# Patient Record
Sex: Female | Born: 1980 | Race: Asian | Hispanic: No | Marital: Married | Smoking: Former smoker
Health system: Southern US, Community
[De-identification: ages and names within clinical notes are randomized; demographics above are authoritative.]

## PROBLEM LIST (undated history)

## (undated) DIAGNOSIS — O99810 Abnormal glucose complicating pregnancy: Secondary | ICD-10-CM

## (undated) DIAGNOSIS — Z789 Other specified health status: Secondary | ICD-10-CM

## (undated) DIAGNOSIS — D649 Anemia, unspecified: Secondary | ICD-10-CM

## (undated) DIAGNOSIS — N390 Urinary tract infection, site not specified: Secondary | ICD-10-CM

## (undated) DIAGNOSIS — Z8669 Personal history of other diseases of the nervous system and sense organs: Secondary | ICD-10-CM

## (undated) HISTORY — DX: Anemia, unspecified: D64.9

## (undated) HISTORY — DX: Urinary tract infection, site not specified: N39.0

## (undated) HISTORY — DX: Personal history of other diseases of the nervous system and sense organs: Z86.69

## (undated) HISTORY — PX: NO PAST SURGERIES: SHX2092

---
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
## 2004-09-29 ENCOUNTER — Emergency Department: Payer: Self-pay | Admitting: Emergency Medicine

## 2008-08-31 ENCOUNTER — Encounter: Payer: Self-pay | Admitting: Family Medicine

## 2008-08-31 ENCOUNTER — Ambulatory Visit: Payer: Self-pay | Admitting: Obstetrics and Gynecology

## 2008-08-31 LAB — CONVERTED CEMR LAB
Antibody Screen: NEGATIVE
Basophils Relative: 0 % (ref 0–1)
Eosinophils Absolute: 0.3 10*3/uL (ref 0.0–0.7)
Hemoglobin: 13.5 g/dL (ref 12.0–15.0)
Lymphs Abs: 1.6 10*3/uL (ref 0.7–4.0)
MCHC: 32.2 g/dL (ref 30.0–36.0)
MCV: 90.1 fL (ref 78.0–100.0)
Monocytes Absolute: 0.5 10*3/uL (ref 0.1–1.0)
Monocytes Relative: 5 % (ref 3–12)
RBC: 4.65 M/uL (ref 3.87–5.11)
Rh Type: POSITIVE
Rubella: >500 intl units/mL — ABNORMAL HIGH
WBC: 9.7 10*3/uL (ref 4.0–10.5)

## 2008-09-07 ENCOUNTER — Ambulatory Visit (HOSPITAL_COMMUNITY): Admission: RE | Admit: 2008-09-07 | Discharge: 2008-09-07 | Payer: Self-pay | Admitting: Family Medicine

## 2008-09-21 ENCOUNTER — Encounter: Payer: Self-pay | Admitting: Family Medicine

## 2008-09-21 ENCOUNTER — Ambulatory Visit: Payer: Self-pay | Admitting: Obstetrics and Gynecology

## 2008-09-21 LAB — CONVERTED CEMR LAB: GC Probe Amp, Genital: NEGATIVE

## 2008-10-19 ENCOUNTER — Ambulatory Visit: Payer: Self-pay | Admitting: Obstetrics and Gynecology

## 2008-11-16 ENCOUNTER — Ambulatory Visit: Payer: Self-pay | Admitting: Obstetrics & Gynecology

## 2008-11-17 ENCOUNTER — Encounter: Payer: Self-pay | Admitting: Family Medicine

## 2008-11-17 LAB — CONVERTED CEMR LAB: Yeast Wet Prep HPF POC: NONE SEEN

## 2008-12-07 ENCOUNTER — Ambulatory Visit (HOSPITAL_COMMUNITY): Admission: RE | Admit: 2008-12-07 | Discharge: 2008-12-07 | Payer: Self-pay | Admitting: Obstetrics & Gynecology

## 2008-12-14 ENCOUNTER — Encounter: Payer: Self-pay | Admitting: Family Medicine

## 2008-12-14 ENCOUNTER — Ambulatory Visit: Payer: Self-pay | Admitting: Obstetrics & Gynecology

## 2009-01-11 ENCOUNTER — Ambulatory Visit: Payer: Self-pay | Admitting: Obstetrics & Gynecology

## 2009-02-09 ENCOUNTER — Ambulatory Visit: Payer: Self-pay | Admitting: Obstetrics & Gynecology

## 2009-02-09 LAB — CONVERTED CEMR LAB
HCT: 37.6 % (ref 36.0–46.0)
Hemoglobin: 12.5 g/dL (ref 12.0–15.0)
MCV: 89.1 fL (ref 78.0–100.0)
Platelets: 174 10*3/uL (ref 150–400)
WBC: 15.2 10*3/uL — ABNORMAL HIGH (ref 4.0–10.5)

## 2009-03-01 ENCOUNTER — Ambulatory Visit: Payer: Self-pay | Admitting: Obstetrics & Gynecology

## 2009-03-08 ENCOUNTER — Ambulatory Visit (HOSPITAL_COMMUNITY): Admission: RE | Admit: 2009-03-08 | Discharge: 2009-03-08 | Payer: Self-pay | Admitting: Family Medicine

## 2009-03-15 ENCOUNTER — Ambulatory Visit: Payer: Self-pay | Admitting: Obstetrics & Gynecology

## 2009-03-29 ENCOUNTER — Ambulatory Visit: Payer: Self-pay | Admitting: Family Medicine

## 2009-03-29 LAB — CONVERTED CEMR LAB
Chlamydia, DNA Probe: NEGATIVE
GC Probe Amp, Genital: NEGATIVE

## 2009-03-30 ENCOUNTER — Encounter: Payer: Self-pay | Admitting: Family Medicine

## 2009-04-06 ENCOUNTER — Ambulatory Visit: Payer: Self-pay | Admitting: Obstetrics & Gynecology

## 2009-04-06 LAB — CONVERTED CEMR LAB: GC Probe Amp, Genital: NEGATIVE

## 2009-04-07 ENCOUNTER — Encounter: Payer: Self-pay | Admitting: Obstetrics & Gynecology

## 2009-04-15 ENCOUNTER — Ambulatory Visit: Payer: Self-pay | Admitting: Obstetrics & Gynecology

## 2009-04-19 ENCOUNTER — Ambulatory Visit: Payer: Self-pay | Admitting: Family Medicine

## 2009-04-22 ENCOUNTER — Inpatient Hospital Stay (HOSPITAL_COMMUNITY): Admission: AD | Admit: 2009-04-22 | Discharge: 2009-04-25 | Payer: Self-pay | Admitting: Obstetrics & Gynecology

## 2009-04-22 ENCOUNTER — Ambulatory Visit: Payer: Self-pay | Admitting: Family

## 2009-06-07 ENCOUNTER — Ambulatory Visit: Payer: Self-pay | Admitting: Obstetrics and Gynecology

## 2009-06-07 ENCOUNTER — Other Ambulatory Visit: Admission: RE | Admit: 2009-06-07 | Discharge: 2009-06-07 | Payer: Self-pay | Admitting: Obstetrics and Gynecology

## 2009-06-07 ENCOUNTER — Encounter: Payer: Self-pay | Admitting: Obstetrics and Gynecology

## 2010-11-04 LAB — CBC
MCV: 91.4 fL (ref 78.0–100.0)
Platelets: 165 10*3/uL (ref 150–400)
RBC: 4.66 MIL/uL (ref 3.87–5.11)
WBC: 21.4 10*3/uL — ABNORMAL HIGH (ref 4.0–10.5)

## 2010-11-04 LAB — RPR: RPR Ser Ql: NONREACTIVE

## 2011-08-26 ENCOUNTER — Other Ambulatory Visit: Payer: Self-pay | Admitting: Obstetrics and Gynecology

## 2011-10-26 ENCOUNTER — Other Ambulatory Visit: Payer: Self-pay | Admitting: Obstetrics and Gynecology

## 2012-06-22 ENCOUNTER — Emergency Department: Payer: Self-pay | Admitting: Unknown Physician Specialty

## 2015-01-20 LAB — OB RESULTS CONSOLE RUBELLA ANTIBODY, IGM: Rubella: IMMUNE

## 2015-01-20 LAB — OB RESULTS CONSOLE VARICELLA ZOSTER ANTIBODY, IGG: Varicella: IMMUNE

## 2015-01-21 LAB — OB RESULTS CONSOLE VARICELLA ZOSTER ANTIBODY, IGG: Varicella: IMMUNE

## 2015-01-21 LAB — OB RESULTS CONSOLE RUBELLA ANTIBODY, IGM: Rubella: IMMUNE

## 2015-01-22 LAB — SICKLE CELL SCREEN: Sickle Cell Screen: NEGATIVE

## 2015-05-05 ENCOUNTER — Other Ambulatory Visit: Payer: Self-pay

## 2015-05-05 DIAGNOSIS — O283 Abnormal ultrasonic finding on antenatal screening of mother: Secondary | ICD-10-CM

## 2015-05-06 ENCOUNTER — Ambulatory Visit (HOSPITAL_BASED_OUTPATIENT_CLINIC_OR_DEPARTMENT_OTHER)
Admission: RE | Admit: 2015-05-06 | Discharge: 2015-05-06 | Disposition: A | Discharge status: Home / Self Care | Payer: Medicaid Other | Source: Ambulatory Visit

## 2015-05-06 ENCOUNTER — Ambulatory Visit
Admission: RE | Admit: 2015-05-06 | Discharge: 2015-05-06 | Disposition: A | Discharge status: Home / Self Care | Payer: Medicaid Other | Source: Ambulatory Visit | Attending: Maternal & Fetal Medicine | Admitting: Maternal & Fetal Medicine

## 2015-05-06 DIAGNOSIS — O283 Abnormal ultrasonic finding on antenatal screening of mother: Secondary | ICD-10-CM | POA: Diagnosis not present

## 2015-05-06 DIAGNOSIS — Z3A26 26 weeks gestation of pregnancy: Secondary | ICD-10-CM | POA: Insufficient documentation

## 2015-05-06 DIAGNOSIS — Z31438 Encounter for other genetic testing of female for procreative management: Secondary | ICD-10-CM | POA: Insufficient documentation

## 2015-05-06 HISTORY — DX: Other specified health status: Z78.9

## 2015-05-06 NOTE — Progress Notes (Signed)
Agree with assessment and plan as outlined by CGC wells.

## 2015-05-06 NOTE — Progress Notes (Signed)
Referring provider: [facility] Department Length of Consultation: 20 minutes   Brooke Anthony  was referred for genetic counseling following a level 2 ultrasound at [facility].  This letter is a summary of our discussion.  At the time of this visit, a detailed ultrasound was performed.  The gestational age was confirmed to be 26 weeks.  The detailed fetal anatomy was seen and appeared normal, including the structures of the fetal heart.  An echogenic intracardiac focus was noted in the left and right ventricles of the heart.    An echogenic intracardiac focus (EIF) is a bright spot in the heart.  They are usually found in the left ventricle, which is one of the lower chambers of the heart.  This finding is thought to occur in at least 3-5% of second trimester ultrasounds as a result of microcalcifications in the papillary muscle of the heart.  While it is most likely a normal variation, some studies have found an association between this finding and chromosome abnormalities.  The current data suggests that as an isolated ultrasound finding, the chance for a chromosome problem is not expected to be higher than the risk from an amniocentesis (1 in 200).  This is particularly thought to be true in women under the age of 35 years or who have had normal results from some type of maternal serum screen for aneuploidy.  To review, chromosomes are the inherited structures that contain our genes (traits).  Each cell of our body normally has 46 chromosomes, matched up in 23 pairs.  The last pair determines our gender and are called the sex chromosomes.  A female has an X and a Y chromosome, while a female has two X chromosomes.  Rarely, when a mother's egg or father's sperm unite, an extra or missing chromosome can be passed on to the baby by mistake.  We discussed examples of such a problem including: Down syndrome (an extra 74) and Edward syndrome (an extra 18), both involving  some degree of mental retardation and physical problems.  Before this ultrasound, there was a 1 in approximately 244 chance to having a baby with a chromosome problem based on Brooke Anthony's age.  Now that the echogenic focus has been seen, the risk has increased, though exactly by how much is difficult to determine.    We discussed the following prenatal testing options for this pregnancy:  Amniocentesis involves the removal of a small amount of amniotic fluid from the sac surrounding the fetus with the use of a thin needle inserted through the mother's abdomen and uterus.  Ultrasound guidance is used throughout the procedure.  Fetal cells are directly evaluated and >99.5% of chromosome problems and >98% of neural tube defects can be detected.  The main risks to this procedure are complications leading to miscarriage in less than 1 in 200 cases (0.5%).  Ultrasound can sometimes detect other fetal anatomy problems (e.g. a heart defect) that suggest a chromosome problem; however, this is often not the case.  If the remainder of the fetal anatomy was seen well today, we do not recommend additional imaging of the fetal heart in the future.  We also reviewed the availability of cell free fetal DNA testing from maternal blood to determine whether or not the baby may have Down syndrome, trisomy 65, or trisomy 66.  This test utilizes a maternal blood sample and DNA sequencing technology to isolate circulating cell free fetal DNA from maternal plasma.  The fetal  DNA can then be analyzed for DNA sequences that are derived from the three most common chromosomes involved in aneuploidy, chromosomes 13, 18, and 21.  If the overall amount of DNA is greater than the expected level for any of these chromosomes, aneuploidy is suspected. While we do not consider it a replacement for invasive testing and karyotype analysis, a negative result from this testing would be reassuring, though not a guarantee of a normal chromosome  complement for the baby.  An abnormal result is certainly suggestive of an abnormal chromosome complement, though we would still recommend CVS or amniocentesis to confirm any findings from this testing.    Lastly, we mentioned that each pregnancy in the general population has a 2-3% chance for a birth defect that might not be detected prenatally.  We inquired about the family history.  The family history was reported to be unremarkable for birth defects, mental retardation, recurrent pregnancy loss or known chromosome abnormalities.  The pregnancy history was reported to be unremarkable for complications or exposures which would be expected to increase the concern for this pregnancy.   After consideration of the options, the patient elected to decline any additional screening or testing.  The patient was encouraged to call with questions or concerns.  We can be contacted at [phone].   Cherly Anderson, MS, CGC

## 2015-07-06 ENCOUNTER — Emergency Department: Payer: Medicaid Other

## 2015-07-06 ENCOUNTER — Encounter: Payer: Self-pay | Admitting: Emergency Medicine

## 2015-07-06 ENCOUNTER — Emergency Department
Admission: EM | Admit: 2015-07-06 | Discharge: 2015-07-06 | Disposition: A | Discharge status: Home / Self Care | Payer: Medicaid Other | Attending: Emergency Medicine | Admitting: Emergency Medicine

## 2015-07-06 DIAGNOSIS — Z3A35 35 weeks gestation of pregnancy: Secondary | ICD-10-CM | POA: Diagnosis not present

## 2015-07-06 DIAGNOSIS — O1203 Gestational edema, third trimester: Secondary | ICD-10-CM | POA: Insufficient documentation

## 2015-07-06 DIAGNOSIS — R0602 Shortness of breath: Secondary | ICD-10-CM | POA: Insufficient documentation

## 2015-07-06 DIAGNOSIS — R911 Solitary pulmonary nodule: Secondary | ICD-10-CM | POA: Insufficient documentation

## 2015-07-06 DIAGNOSIS — O99333 Smoking (tobacco) complicating pregnancy, third trimester: Secondary | ICD-10-CM | POA: Diagnosis not present

## 2015-07-06 DIAGNOSIS — Z79899 Other long term (current) drug therapy: Secondary | ICD-10-CM | POA: Diagnosis not present

## 2015-07-06 DIAGNOSIS — O9989 Other specified diseases and conditions complicating pregnancy, childbirth and the puerperium: Secondary | ICD-10-CM | POA: Insufficient documentation

## 2015-07-06 DIAGNOSIS — R079 Chest pain, unspecified: Secondary | ICD-10-CM | POA: Diagnosis not present

## 2015-07-06 DIAGNOSIS — F17200 Nicotine dependence, unspecified, uncomplicated: Secondary | ICD-10-CM | POA: Insufficient documentation

## 2015-07-06 LAB — CBC
HCT: 38.9 % (ref 35.0–47.0)
Hemoglobin: 13 g/dL (ref 12.0–16.0)
MCH: 30.3 pg (ref 26.0–34.0)
MCHC: 33.3 g/dL (ref 32.0–36.0)
MCV: 91 fL (ref 80.0–100.0)
PLATELETS: 180 10*3/uL (ref 150–440)
RBC: 4.27 MIL/uL (ref 3.80–5.20)
RDW: 13.5 % (ref 11.5–14.5)
WBC: 18.8 10*3/uL — ABNORMAL HIGH (ref 3.6–11.0)

## 2015-07-06 LAB — BASIC METABOLIC PANEL
Anion gap: 7 (ref 5–15)
BUN: 8 mg/dL (ref 6–20)
CALCIUM: 8.9 mg/dL (ref 8.9–10.3)
CO2: 23 mmol/L (ref 22–32)
CREATININE: 0.36 mg/dL — AB (ref 0.44–1.00)
Chloride: 105 mmol/L (ref 101–111)
GFR calc Af Amer: >60 mL/min (ref >60)
GFR calc non Af Amer: >60 mL/min (ref >60)
GLUCOSE: 87 mg/dL (ref 65–99)
Potassium: 4 mmol/L (ref 3.5–5.1)
Sodium: 135 mmol/L (ref 135–145)

## 2015-07-06 LAB — BRAIN NATRIURETIC PEPTIDE: B Natriuretic Peptide: 12 pg/mL (ref 0.0–100.0)

## 2015-07-06 LAB — TROPONIN I

## 2015-07-06 MED ORDER — IOHEXOL 350 MG/ML SOLN
75.0000 mL | Freq: Once | INTRAVENOUS | Status: AC | PRN
  Administered 2015-07-06: 75 mL via INTRAVENOUS
  Filled 2015-07-06: qty 75

## 2015-07-06 NOTE — ED Provider Notes (Signed)
[facility] Emergency Department Provider Note   ____________________________________________  Time seen:  I have reviewed the triage vital signs and the triage nursing note.  HISTORY  Chief Complaint Chest Pain and Shortness of Breath   Historian Patient  HPI Brooke Anthony is a 34 y.o. female who was sent here for evaluation of intermittent complaint of chest pain, as well as an episode of coughing up a small my blood this weekend, from [facility]. She is currently [redacted] weeks pregnant. She has had some mild lower extremity edema towards the end of pregnancy. She's had some mild shortness of breath but no specific pleuritic chest pain. The chest discomfort has not been exertional and is located centrally and feels like pressure, but none now and none today.  No fever. No recent illness.    Past Medical History  Diagnosis Date  . Medical history non-contributory     Patient Active Problem List   Diagnosis Date Noted  . Echogenic intracardiac focus of fetus on prenatal ultrasound 05/06/2015    Past Surgical History  Procedure Laterality Date  . No past surgeries      Current Outpatient Rx  Name  Route  Sig  Dispense  Refill  . Prenatal Vit-Fe Fumarate-FA (PRENATAL MULTIVITAMIN) TABS tablet   Oral   Take 1 tablet by mouth daily at 12 noon.           Allergies Review of patient's allergies indicates no known allergies.  History reviewed. No pertinent family history.  Social History Social History  Substance Use Topics  . Smoking status: Current Every Day Smoker -- 0.50 packs/day  . Smokeless tobacco: Never Used  . Alcohol Use: No    Review of Systems  Constitutional: Negative for fever. Eyes: Negative for visual changes. ENT: Negative for sore throat. Cardiovascular: Negative for palpitations. Respiratory: Negative for pleuritic chest pain. Gastrointestinal: Negative for abdominal pain,  vomiting and diarrhea. Genitourinary: Negative for dysuria. Musculoskeletal: Negative for back pain. Skin: Negative for rash. Neurological: Negative for headache. 10 point Review of Systems otherwise negative ____________________________________________   PHYSICAL EXAM:  VITAL SIGNS: ED Triage Vitals  Enc Vitals Group     BP 07/06/15 1310 104/61 mmHg     Pulse Rate 07/06/15 1310 93     Resp 07/06/15 1310 18     Temp 07/06/15 1310 98.3 F (36.8 C)     Temp Source 07/06/15 1310 Oral     SpO2 07/06/15 1310 98 %     Weight 07/06/15 1310 156 lb (70.761 kg)     Height 07/06/15 1310  (1.549 m)     Head Cir --      Peak Flow --      Pain Score 07/06/15 1312 0     Pain Loc --      Pain Edu? --      Excl. in GC? --      Constitutional: Alert and oriented. Well appearing and in no distress. Eyes: Conjunctivae are normal. PERRL. Normal extraocular movements. ENT   Head: Normocephalic and atraumatic.   Nose: No congestion/rhinnorhea.   Mouth/Throat: Mucous membranes are moist.   Neck: No stridor. Cardiovascular/Chest: Normal rate, regular rhythm.  No murmurs, rubs, or gallops. Respiratory: Normal respiratory effort without tachypnea nor retractions. Breath sounds are clear and equal bilaterally. No wheezes/rales/rhonchi. Gastrointestinal: Soft. No distention, no guarding, no rebound. Gravid consistent with third trimester.  Genitourinary/rectal:Deferred Musculoskeletal: Nontender with normal range of motion in all  extremities. No joint effusions.  No lower extremity tenderness.  Trace lower extremity edema. Neurologic:  Normal speech and language. No gross or focal neurologic deficits are appreciated. Skin:  Skin is warm, dry and intact. No rash noted. Psychiatric: Mood and affect are normal. Speech and behavior are normal. Patient exhibits appropriate insight and judgment.  ____________________________________________   EKG I, Governor Rooksebecca Netasha Wehrli, MD, the attending  physician have personally viewed and interpreted all ECGs.  100 bpm. Normal sinus rhythm. Narrow QRS. Axis. Nonspecific ST/T wave. Findings of S wave in lead 1, Q waves in lead 3 and inverted T-wave in lead 3 ____________________________________________  LABS (pertinent positives/negatives)  Basic metabolic panel without significant abnormality CBC shows a white blood count of 18.8, hemoglobin 13.0 and 3180 Troponin less than 0.03 BNP: 12  ____________________________________________  RADIOLOGY All Xrays were viewed by me. Imaging interpreted by Radiologist.  CT chest with contrast:  IMPRESSION: No demonstrable pulmonary embolus. No thoracic aortic aneurysm or dissection. No edema or consolidation.  There is a 4 mm nodular opacity in the right upper lobe. Followup of this nodular opacity should be based on Fleischner Society guidelines. If the patient is at high risk for bronchogenic carcinoma, follow-up chest CT at 1 year is recommended. If the patient is at low risk, no follow-up is needed. This recommendation follows the consensus statement: Guidelines for Management of Small Pulmonary Nodules Detected on CT Scans: A Statement from the Fleischner Society as published in Radiology 2005; 237:395-400.  No thoracic adenopathy.  Hepatic steatosis.  Chest x-ray two-view:  IMPRESSION: 1. Mild pulmonary arterial prominence is likely secondary to pregnancy. 2. No focal airspace disease or effusion. __________________________________________  PROCEDURES  Procedure(s) performed: None  Critical Care performed: None  ____________________________________________   ED COURSE / ASSESSMENT AND PLAN  CONSULTATIONS: None  Pertinent labs & imaging results that were available during my care of the patient were reviewed by me and considered in my medical decision making (see chart for details).   Patient seems to be asymptomatic here with no chest pain or shortness of  breath. She's not hypoxic. She is not a fever. Initially I felt her symptoms very unlikely to be due to a pulmonary embolism, however upon reviewing her EKG showing a slight tachycardia of heartbeat 100, and long with the findings of S1, every 3, T3, I did discuss the risk versus benefit of obtaining CT to rule out pulmonary embolism with the patient we chose to proceed. I did discuss the choice of CT versus VQ with the radiologist, who recommended CT with shielding.  I did add on a BNP as patient is ready to leave the emergency department. My suspicion for congestive heart failure/ peripartum heart failure is low given again she seems asymptomatic now. I will call her with the result of this.  We discussed the incidental finding of pulmonary nodule and she is a smoker, and so I did discuss the recommendation of repeat CAT scan at one year. She is to follow-up with a primary care physician in order to complete this.  BNP came back normal at 12  Patient / Family / Caregiver informed of clinical course, medical decision-making process, and agree with plan.   I discussed return precautions, follow-up instructions, and discharged instructions with patient and/or family.  ___________________________________________   FINAL CLINICAL IMPRESSION(S) / ED DIAGNOSES   Final diagnoses:  Nonspecific chest pain  Pulmonary nodule       Governor Rooksebecca Holten Spano, MD 07/06/15 1757

## 2015-07-06 NOTE — ED Notes (Addendum)
Pt seen by her obstetrician just pta; pt is due 08/08/14 with 3rd child;  sent to ED for evaluation for PE; c/o chest pain and shortness of breath for 3 weeks; states she was coughing up blood this weekend;

## 2015-07-06 NOTE — ED Notes (Signed)
[redacted]wks pregnant   Pt denies pain - reports some chest pain over the weekend along with some bloody sputum  Reports cold symptoms for approx 1 week

## 2015-07-06 NOTE — Discharge Instructions (Signed)
You were evaluated for episodes of chest discomfort, and your exam and evaluation are reassuring today. We discussed your CT scan showed a pulmonary nodule, discussed follow-up for this with your primary care physician.  You will be called tonight with results of one more blood test, looking for signs of heart failure.  Return to the emergency department for any new or worsening condition including chest pain, trouble breathing, shortness of breath, dizziness, weakness, numbness, fever, or any other symptoms concerning to you.    Nonspecific Chest Pain  Chest pain can be caused by many different conditions. There is always a chance that your pain could be related to something serious, such as a heart attack or a blood clot in your lungs. Chest pain can also be caused by conditions that are not life-threatening. If you have chest pain, it is very important to follow up with your health care provider. CAUSES  Chest pain can be caused by:  Heartburn.  Pneumonia or bronchitis.  Anxiety or stress.  Inflammation around your heart (pericarditis) or lung (pleuritis or pleurisy).  A blood clot in your lung.  A collapsed lung (pneumothorax). It can develop suddenly on its own (spontaneous pneumothorax) or from trauma to the chest.  Shingles infection (varicella-zoster virus).  Heart attack.  Damage to the bones, muscles, and cartilage that make up your chest wall. This can include:  Bruised bones due to injury.  Strained muscles or cartilage due to frequent or repeated coughing or overwork.  Fracture to one or more ribs.  Sore cartilage due to inflammation (costochondritis). RISK FACTORS  Risk factors for chest pain may include:  Activities that increase your risk for trauma or injury to your chest.  Respiratory infections or conditions that cause frequent coughing.  Medical conditions or overeating that can cause heartburn.  Heart disease or family history of heart  disease.  Conditions or health behaviors that increase your risk of developing a blood clot.  Having had chicken pox (varicella zoster). SIGNS AND SYMPTOMS Chest pain can feel like:  Burning or tingling on the surface of your chest or deep in your chest.  Crushing, pressure, aching, or squeezing pain.  Dull or sharp pain that is worse when you move, cough, or take a deep breath.  Pain that is also felt in your back, neck, shoulder, or arm, or pain that spreads to any of these areas. Your chest pain may come and go, or it may stay constant. DIAGNOSIS Lab tests or other studies may be needed to find the cause of your pain. Your health care provider may have you take a test called an ambulatory ECG (electrocardiogram). An ECG records your heartbeat patterns at the time the test is performed. You may also have other tests, such as:  Transthoracic echocardiogram (TTE). During echocardiography, sound waves are used to create a picture of all of the heart structures and to look at how blood flows through your heart.  Transesophageal echocardiogram (TEE).This is a more advanced imaging test that obtains images from inside your body. It allows your health care provider to see your heart in finer detail.  Cardiac monitoring. This allows your health care provider to monitor your heart rate and rhythm in real time.  Holter monitor. This is a portable device that records your heartbeat and can help to diagnose abnormal heartbeats. It allows your health care provider to track your heart activity for several days, if needed.  Stress tests. These can be done through exercise or by taking  medicine that makes your heart beat more quickly.  Blood tests.  Imaging tests. TREATMENT  Your treatment depends on what is causing your chest pain. Treatment may include:  Medicines. These may include:  Acid blockers for heartburn.  Anti-inflammatory medicine.  Pain medicine for inflammatory  conditions.  Antibiotic medicine, if an infection is present.  Medicines to dissolve blood clots.  Medicines to treat coronary artery disease.  Supportive care for conditions that do not require medicines. This may include:  Resting.  Applying heat or cold packs to injured areas.  Limiting activities until pain decreases. HOME CARE INSTRUCTIONS  If you were prescribed an antibiotic medicine, finish it all even if you start to feel better.  Avoid any activities that bring on chest pain.  Do not use any tobacco products, including cigarettes, chewing tobacco, or electronic cigarettes. If you need help quitting, ask your health care provider.  Do not drink alcohol.  Take medicines only as directed by your health care provider.  Keep all follow-up visits as directed by your health care provider. This is important. This includes any further testing if your chest pain does not go away.  If heartburn is the cause for your chest pain, you may be told to keep your head raised (elevated) while sleeping. This reduces the chance that acid will go from your stomach into your esophagus.  Make lifestyle changes as directed by your health care provider. These may include:  Getting regular exercise. Ask your health care provider to suggest some activities that are safe for you.  Eating a heart-healthy diet. A registered dietitian can help you to learn healthy eating options.  Maintaining a healthy weight.  Managing diabetes, if necessary.  Reducing stress. SEEK MEDICAL CARE IF:  Your chest pain does not go away after treatment.  You have a rash with blisters on your chest.  You have a fever. SEEK IMMEDIATE MEDICAL CARE IF:   Your chest pain is worse.  You have an increasing cough, or you cough up blood.  You have severe abdominal pain.  You have severe weakness.  You faint.  You have chills.  You have sudden, unexplained chest discomfort.  You have sudden, unexplained  discomfort in your arms, back, neck, or jaw.  You have shortness of breath at any time.  You suddenly start to sweat, or your skin gets clammy.  You feel nauseous or you vomit.  You suddenly feel light-headed or dizzy.  Your heart begins to beat quickly, or it feels like it is skipping beats. These symptoms may represent a serious problem that is an emergency. Do not wait to see if the symptoms will go away. Get medical help right away. Call your local emergency services (911 in the [place] Do not drive yourself to the hospital.   This information is not intended to replace advice given to you by your health care provider. Make sure you discuss any questions you have with your health care provider.   Document Released: 04/26/2005 Document Revised: 08/07/2014 Document Reviewed: 02/20/2014 Elsevier Interactive Patient Education [employer]

## 2015-08-01 NOTE — L&D Delivery Note (Signed)
Obstetrical Delivery Note   Date of Delivery:   08/04/2015 Primary OB:   [facility] Gestational Age/EDD: 4049w2d  Antepartum complications: fetal bilateral echogenic foci (cardiac), tobacco abuse  Delivered By:   Cornelia Copaharlie Maayan Jenning, Jr MD  Delivery Type:   spontaneous vaginal delivery  Delivery Details:   CTSP and on SVE was complete and +2. Over next UC, patient delivered LOA infant (nucahl cord x 1 and reduced) w/o difficulty. Delayed cord clamping and cut by father Anesthesia:    local Intrapartum complications: None GBS:    Unknown but no risk factors so not treated. Swab obtained on admission and pending Laceration:    1st degree repaired and left labia majora skin abrasion repaired. Both with 2-0 and 3-0 vicryl after injection of 1% lidocaine Episiotomy:    none Placenta:    Delivered and expressed via active management. Intact: yes. To pathology: no.  Estimated Blood Loss:  250mL  Baby:    Liveborn female, APGARs 8/9, weight 3460gm  Cornelia Copaharlie Alitzel Cookson, Jr. MD [facility] Pager [phone]

## 2015-08-04 ENCOUNTER — Encounter: Payer: Self-pay | Admitting: *Deleted

## 2015-08-04 ENCOUNTER — Inpatient Hospital Stay
Admission: EM | Admit: 2015-08-04 | Discharge: 2015-08-06 | DRG: 775 | Disposition: A | Discharge status: Home / Self Care | Payer: Medicaid Other | Attending: Obstetrics and Gynecology | Admitting: Obstetrics and Gynecology

## 2015-08-04 DIAGNOSIS — Z3A39 39 weeks gestation of pregnancy: Secondary | ICD-10-CM | POA: Diagnosis not present

## 2015-08-04 DIAGNOSIS — F17200 Nicotine dependence, unspecified, uncomplicated: Secondary | ICD-10-CM | POA: Diagnosis present

## 2015-08-04 DIAGNOSIS — Z79899 Other long term (current) drug therapy: Secondary | ICD-10-CM

## 2015-08-04 DIAGNOSIS — O99334 Smoking (tobacco) complicating childbirth: Secondary | ICD-10-CM | POA: Diagnosis present

## 2015-08-04 LAB — CBC
HEMATOCRIT: 42.8 % (ref 35.0–47.0)
Hemoglobin: 14.2 g/dL (ref 12.0–16.0)
MCH: 30.5 pg (ref 26.0–34.0)
MCHC: 33.3 g/dL (ref 32.0–36.0)
MCV: 91.5 fL (ref 80.0–100.0)
Platelets: 154 10*3/uL (ref 150–440)
RBC: 4.67 MIL/uL (ref 3.80–5.20)
RDW: 14.3 % (ref 11.5–14.5)
WBC: 17.7 10*3/uL — AB (ref 3.6–11.0)

## 2015-08-04 LAB — TYPE AND SCREEN
ABO/RH(D): O POS
Antibody Screen: NEGATIVE

## 2015-08-04 LAB — CHLAMYDIA/NGC RT PCR (ARMC ONLY)
Chlamydia Tr: NOT DETECTED
N GONORRHOEAE: NOT DETECTED

## 2015-08-04 LAB — ABO/RH: ABO/RH(D): O POS

## 2015-08-04 LAB — URINE DRUG SCREEN, QUALITATIVE (ARMC ONLY)
AMPHETAMINES, UR SCREEN: NOT DETECTED
Barbiturates, Ur Screen: NOT DETECTED
Benzodiazepine, Ur Scrn: NOT DETECTED
COCAINE METABOLITE, UR ~~LOC~~: NOT DETECTED
Cannabinoid 50 Ng, Ur ~~LOC~~: NOT DETECTED
MDMA (ECSTASY) UR SCREEN: NOT DETECTED
Methadone Scn, Ur: NOT DETECTED
Opiate, Ur Screen: NOT DETECTED
PHENCYCLIDINE (PCP) UR S: NOT DETECTED
Tricyclic, Ur Screen: NOT DETECTED

## 2015-08-04 LAB — RAPID HIV SCREEN (HIV 1/2 AB+AG)
HIV 1/2 Antibodies: NONREACTIVE
HIV-1 P24 Antigen - HIV24: NONREACTIVE

## 2015-08-04 MED ORDER — PRENATAL MULTIVITAMIN CH
1.0000 | ORAL_TABLET | Freq: Every day | ORAL | Status: DC
  Administered 2015-08-04 – 2015-08-06 (×3): 1 via ORAL
  Filled 2015-08-04 (×3): qty 1

## 2015-08-04 MED ORDER — ACETAMINOPHEN 325 MG PO TABS: 650.0000 mg | ORAL_TABLET | ORAL | Status: DC | PRN

## 2015-08-04 MED ORDER — OXYTOCIN 10 UNIT/ML IJ SOLN: 2.5000 [IU]/h | INTRAVENOUS | Status: DC | PRN

## 2015-08-04 MED ORDER — OXYCODONE-ACETAMINOPHEN 5-325 MG PO TABS
1.0000 | ORAL_TABLET | Freq: Four times a day (QID) | ORAL | Status: DC | PRN
  Administered 2015-08-04: 1 via ORAL
  Filled 2015-08-04: qty 1

## 2015-08-04 MED ORDER — LACTATED RINGERS IV SOLN
INTRAVENOUS | Status: DC
  Administered 2015-08-04: 07:00:00 via INTRAVENOUS

## 2015-08-04 MED ORDER — CITRIC ACID-SODIUM CITRATE 334-500 MG/5ML PO SOLN: 30.0000 mL | ORAL | Status: DC | PRN

## 2015-08-04 MED ORDER — DOCUSATE SODIUM 100 MG PO CAPS: 100.0000 mg | ORAL_CAPSULE | Freq: Two times a day (BID) | ORAL | Status: DC | PRN

## 2015-08-04 MED ORDER — LIDOCAINE HCL (PF) 1 % IJ SOLN
INTRAMUSCULAR | Status: AC
  Administered 2015-08-04: 30 mL via SUBCUTANEOUS
  Filled 2015-08-04: qty 30

## 2015-08-04 MED ORDER — ONDANSETRON HCL 4 MG/2ML IJ SOLN: 4.0000 mg | Freq: Four times a day (QID) | INTRAMUSCULAR | Status: DC | PRN

## 2015-08-04 MED ORDER — AMMONIA AROMATIC IN INHA
RESPIRATORY_TRACT | Status: AC
  Filled 2015-08-04: qty 10

## 2015-08-04 MED ORDER — WITCH HAZEL-GLYCERIN EX PADS: 1.0000 "application " | MEDICATED_PAD | CUTANEOUS | Status: DC | PRN

## 2015-08-04 MED ORDER — ONDANSETRON HCL 4 MG PO TABS: 4.0000 mg | ORAL_TABLET | ORAL | Status: DC | PRN

## 2015-08-04 MED ORDER — OXYTOCIN 10 UNIT/ML IJ SOLN: 10.0000 [IU] | Freq: Once | INTRAMUSCULAR | Status: DC

## 2015-08-04 MED ORDER — DIPHENHYDRAMINE HCL 25 MG PO CAPS: 25.0000 mg | ORAL_CAPSULE | Freq: Four times a day (QID) | ORAL | Status: DC | PRN

## 2015-08-04 MED ORDER — OXYTOCIN 10 UNIT/ML IJ SOLN
INTRAMUSCULAR | Status: AC
  Filled 2015-08-04: qty 2

## 2015-08-04 MED ORDER — DIBUCAINE 1 % RE OINT: 1.0000 "application " | TOPICAL_OINTMENT | RECTAL | Status: DC | PRN

## 2015-08-04 MED ORDER — LIDOCAINE HCL (PF) 1 % IJ SOLN
30.0000 mL | INTRAMUSCULAR | Status: DC | PRN
  Administered 2015-08-04: 30 mL via SUBCUTANEOUS
  Filled 2015-08-04: qty 30

## 2015-08-04 MED ORDER — IBUPROFEN 600 MG PO TABS
600.0000 mg | ORAL_TABLET | Freq: Four times a day (QID) | ORAL | Status: DC
  Administered 2015-08-04 – 2015-08-06 (×8): 600 mg via ORAL
  Filled 2015-08-04 (×8): qty 1

## 2015-08-04 MED ORDER — LACTATED RINGERS IV SOLN: 500.0000 mL | INTRAVENOUS | Status: DC | PRN

## 2015-08-04 MED ORDER — OXYTOCIN BOLUS FROM INFUSION
500.0000 mL | INTRAVENOUS | Status: DC
  Administered 2015-08-04: 250 mL via INTRAVENOUS

## 2015-08-04 MED ORDER — LANOLIN HYDROUS EX OINT: TOPICAL_OINTMENT | CUTANEOUS | Status: DC | PRN

## 2015-08-04 MED ORDER — MISOPROSTOL 200 MCG PO TABS
ORAL_TABLET | ORAL | Status: AC
  Filled 2015-08-04: qty 4

## 2015-08-04 MED ORDER — ONDANSETRON HCL 4 MG/2ML IJ SOLN: 4.0000 mg | INTRAMUSCULAR | Status: DC | PRN

## 2015-08-04 MED ORDER — OXYTOCIN 40 UNITS IN LACTATED RINGERS INFUSION - SIMPLE MED
2.5000 [IU]/h | INTRAVENOUS | Status: DC
  Administered 2015-08-04: 2.5 [IU]/h via INTRAVENOUS
  Filled 2015-08-04: qty 1000

## 2015-08-04 MED ORDER — SIMETHICONE 80 MG PO CHEW: 80.0000 mg | CHEWABLE_TABLET | ORAL | Status: DC | PRN

## 2015-08-04 MED ORDER — BENZOCAINE-MENTHOL 20-0.5 % EX AERO: 1.0000 "application " | INHALATION_SPRAY | CUTANEOUS | Status: DC | PRN

## 2015-08-04 NOTE — Progress Notes (Signed)
L&D Note  Starting to feeling stronger UCs. Thinking about pain medications AF VS normal and stable Category I with accels and q1-8938m UCs Mild distress with UCs 8/90/-1/BBOW-->AROM, clear fluid 8/90/0 to +1, no caput Cbc normal  A/P: pt doing well Fetal status reassuring GBS and GC/CT obtained. Pt denies any h/o GBS, group b strep or any NN infections After d/w pt, she was amenable to no IV meds and holding off on epidural and to do AROM. Pelvis adequate, EFW 3600gm with largest prior 7.5lbs. Anticipate SVD  Brooke Anthony, Jr MD [facility]  Pager: [phone]

## 2015-08-04 NOTE — Discharge Summary (Signed)
Obstetrical Discharge Summary  Date of Admission: 08/04/2015 Date of Discharge: 08/06/15 Primary OB: [facility]  Gestational Age at Delivery: 7672w2d   Antepartum complications: fetal bilateral echogenic foci (cardiac), tobacco abuse Reason for Admission: active labor Date of Delivery: 08/06/2015  Delivered By: Cornelia Copaharlie Pickens, Jr MD Delivery Type: spontaneous vaginal delivery Intrapartum complications/course: None Anesthesia: local Placenta: expressed. Intact: yes. To pathology: no.  Laceration: 1st degree repaired and left labia majora abrasion repaired (both with 3-0 and 2-0 vicryl) Episiotomy: none Baby: Liveborn female, APGARs8/9, weight 3460 g.   Postpartum course: uncomplicated care following vaginal delivery Discharge Vital Signs:  Current Vital Signs 24h Vital Sign Ranges  T 98.5 F (36.9 C) Temp  Avg: 98.5 F (36.9 C)  Min: 98.4 F (36.9 C)  Max: 98.6 F (37 C)  BP 123/60 mmHg BP  Min: 114/74  Max: 138/81  HR 83 Pulse  Avg: 86  Min: 74  Max: 96  RR 18 Resp  Avg: 18.5  Min: 18  Max: 20  SaO2 100 % Not Delivered SpO2  Avg: 100 %  Min: 100 %  Max: 100 %       24 Hour I/O Current Shift I/O  Time Ins Outs        Patient Vitals for the past 6 hrs:  BP Temp Temp src Pulse Resp  08/06/15 0807 123/60 mmHg 98.5 F (36.9 C) Oral 83 18    Discharge Exam:  NAD Perineum: 1st degree repaired Abdomen: firm fundus below the umbilicus.  RRR no MRGs CTAB Ext: no c/c/e  Disposition: Home  Rh Immune globulin given: not applicable Rubella vaccine given: not applicable Tdap vaccine given in AP or PP setting: yes Flu vaccine given in AP or PP setting: yes  Contraception: IUD     Prenatal/Postnatal Panel: O POS//Rubella Immune//Varicella Immune//RPR negative//HIV negative/HepB Surface Ag negative/Plan; breastfeeding  Plan:  Brooke Anthony was discharged to home in good condition. Follow-up appointment with [facility] in 4 weeks  Discharge Medications:   Medication List    TAKE  these medications        prenatal multivitamin Tabs tablet  Take 1 tablet by mouth daily at 12 noon.       Tresea MallGLEDHILL,Rosamond Andress, CNM  08/06/15 1002  This patient and plan were discussed with Dr Bonney AidStaebler 08/06/2015

## 2015-08-04 NOTE — H&P (Addendum)
OB History & Physical   History of Present Illness:  Chief Complaint: contractions  HPI:  Brooke Anthony is a 35 y.o. [id number] female at 6571w2d dated by 21 week ultrasound.  Her pregnancy has been complicated by smoking status, bilateral intracardiac echogenic foci.    She reports contractions.   She denies leakage of fluid.   She denies vaginal bleeding.   She reports fetal movement.   Contractions started at 3am today.  Has had a little bit of blood show.  History of two SVDs with largest baby weight 7.5 pounds.  No issues delivering either of her other children.    Maternal Medical History:   Past Medical History  Diagnosis Date  . Medical history non-contributory     Past Surgical History  Procedure Laterality Date  . No past surgeries     Allergies: No Known Allergies  Prior to Admission medications   Medication Sig Start Date End Date Taking? Authorizing Provider  Prenatal Vit-Fe Fumarate-FA (PRENATAL MULTIVITAMIN) TABS tablet Take 1 tablet by mouth daily at 12 noon.   Yes Historical Provider, MD    OB History  Gravida Para Term Preterm AB SAB TAB Ectopic Multiple Living  3 1 1       2     # Outcome Date GA Lbr Len/2nd Weight Sex Delivery Anes PTL Lv  3 Current           2 Gravida 04/23/09     Vag-Spont   Y  1 Term 08/12/01     Vag-Spont   Y     Prenatal care site: [facility]  Social History: She  reports that she has been smoking.  She has never used smokeless tobacco. She reports that she does not drink alcohol or use illicit drugs.  Family History: family history is not on file.   Review of Systems: Negative x 10 systems reviewed except as noted in the HPI.    Physical Exam:  Vital Signs: BP 115/85 mmHg  Pulse 102  Temp(Src) 98.1 F (36.7 C) (Oral)  Resp 18  Ht 5' 1.5" (1.562 m)  Wt 69.4 kg (153 lb)  BMI 28.44 kg/m2  LMP 11/12/2014 General: no acute distress.  HEENT: normocephalic, atraumatic Heart: regular rate & rhythm.  No murmurs/rubs/gallops Lungs:  clear to auscultation bilaterally Abdomen: soft, gravid, non-tender;  EFW: 7.5 pounds Pelvic:   External: Normal external female genitalia  Cervix: Dilation: 7 / Effacement (%): 80 / Station: -2  Extremities: non-tender, symmetric, no edema bilaterally.    Neurologic: Alert & oriented x 3.    Pertinent Results:  Prenatal Labs: Blood type/Rh O positive  Antibody screen negative  Rubella Immune  Varicella Immune    RPR NR  HBsAg negative  HIV negative  GC negative  Chlamydia negative  Genetic screening declined  1 hour GTT 140  3 hour GTT 72, 125, 116, 105  GBS unknown   Baseline FHR: 135 beats/min   Variability: moderate   Accelerations: present   Decelerations: absent Contractions: present frequency: irregular Overall assessment: category 1  Assessment:  Brooke Anthony is a 35 y.o. 284P2012 female at 6071w2d with active labor.   Plan:  1. Admit to Labor & Delivery  2. CBC, T&S, Clrs, IVF 3. GBS unknown, no treatment as she has no risk factors. Guidance per CDC recommendations.   4. [place] well-being: reassuring   Conard NovakJackson, Amitai Delaughter D, MD 08/04/2015 7:35 AM

## 2015-08-04 NOTE — Discharge Instructions (Addendum)
° °Vaginal Delivery, Care After °Refer to this sheet in the next few weeks. These discharge instructions provide you with information on caring for yourself after delivery. Your caregiver may also give you specific instructions. Your treatment has been planned according to the most current medical practices available, but problems sometimes occur. Call your caregiver if you have any problems or questions after you go home. °HOME CARE INSTRUCTIONS °1. Take over-the-counter or prescription medicines only as directed by your caregiver or pharmacist. °2. Do not drink alcohol, especially if you are breastfeeding or taking medicine to relieve pain. °3. Do not smoke tobacco. °4. Continue to use good perineal care. Good perineal care includes: °1. Wiping your perineum from back to front °2. Keeping your perineum clean. °3. You can do sitz baths twice a day, to help keep this area clean °5. Do not use tampons, douche or have sex until your caregiver says it is okay. °6. Shower only and avoid sitting in submerged water, aside from sitz baths °7. Wear a well-fitting bra that provides breast support. °8. Eat healthy foods. °9. Drink enough fluids to keep your urine clear or pale yellow. °10. Eat high-fiber foods such as whole grain cereals and breads, brown rice, beans, and fresh fruits and vegetables every day. These foods may help prevent or relieve constipation. °11. Avoid constipation with high fiber foods or medications, such as miralax or metamucil °12. Follow your caregiver's recommendations regarding resumption of activities such as climbing stairs, driving, lifting, exercising, or traveling. °13. Talk to your caregiver about resuming sexual activities. Resumption of sexual activities is dependent upon your risk of infection, your rate of healing, and your comfort and desire to resume sexual activity. °14. Try to have someone help you with your household activities and your newborn for at least a few days after you  leave the hospital. °15. Rest as much as possible. Try to rest or take a nap when your newborn is sleeping. °16. Increase your activities gradually. °17. Keep all of your scheduled postpartum appointments. It is very important to keep your scheduled follow-up appointments. At these appointments, your caregiver will be checking to make sure that you are healing physically and emotionally. °SEEK MEDICAL CARE IF:  °· You are passing large clots from your vagina. Save any clots to show your caregiver. °· You have a foul smelling discharge from your vagina. °· You have trouble urinating. °· You are urinating frequently. °· You have pain when you urinate. °· You have a change in your bowel movements. °· You have increasing redness, pain, or swelling near your vaginal incision (episiotomy) or vaginal tear. °· You have pus draining from your episiotomy or vaginal tear. °· Your episiotomy or vaginal tear is separating. °· You have painful, hard, or reddened breasts. °· You have a severe headache. °· You have blurred vision or see spots. °· You feel sad or depressed. °· You have thoughts of hurting yourself or your newborn. °· You have questions about your care, the care of your newborn, or medicines. °· You are dizzy or light-headed. °· You have a rash. °· You have nausea or vomiting. °· You were breastfeeding and have not had a menstrual period within 12 weeks after you stopped breastfeeding. °· You are not breastfeeding and have not had a menstrual period by the 12th week after delivery. °· You have a fever. °SEEK IMMEDIATE MEDICAL CARE IF:  °· You have persistent pain. °· You have chest pain. °· You have shortness of breath. °·   You faint. °· You have leg pain. °· You have stomach pain. °· Your vaginal bleeding saturates two or more sanitary pads in 1 hour. °MAKE SURE YOU:  °· Understand these instructions. °· Will watch your condition. °· Will get help right away if you are not doing well or get worse. °Document Released:  07/14/2000 Document Revised: 12/01/2013 Document Reviewed: 03/13/2012 °ExitCare® Patient Information ©2015 [employer]. This information is not intended to replace advice given to you by your health care provider. Make sure you discuss any questions you have with your health care provider. ° °Sitz Bath °A sitz bath is a warm water bath taken in the sitting position. The water covers only the hips and butt (buttocks). We recommend using one that fits in the toilet, to help with ease of use and cleanliness. It may be used for either healing or cleaning purposes. Sitz baths are also used to relieve pain, itching, or muscle tightening (spasms). The water may contain medicine. Moist heat will help you heal and relax.  °HOME CARE  °Take 3 to 4 sitz baths a day. °18. Fill the bathtub half-full with warm water. °19. Sit in the water and open the drain a little. °20. Turn on the warm water to keep the tub half-full. Keep the water running constantly. °21. Soak in the water for 15 to 20 minutes. °22. After the sitz bath, pat the affected area dry. °GET HELP RIGHT AWAY IF: °You get worse instead of better. Stop the sitz baths if you get worse. °MAKE SURE YOU: °· Understand these instructions. °· Will watch your condition. °· Will get help right away if you are not doing well or get worse. °Document Released: 08/24/2004 Document Revised: 04/10/2012 Document Reviewed: 11/14/2010 °ExitCare® Patient Information ©2015 [employer]. This information is not intended to replace advice given to you by your health care provider. Make sure you discuss any questions you have with your health care provider. ° °Call your doctor for increased pain or vaginal bleeding, temperature above 100.4, depression, or concerns.  No strenuous activity or heavy lifting for 6 weeks.  No intercourse, tampons, douching, or enemas for 6 weeks.  No tub baths-showers only.  No driving for 2 weeks or while taking pain medications.  Continue prenatal vitamin  and iron.  Increase calories and fluids while breastfeeding.   °

## 2015-08-05 ENCOUNTER — Encounter: Payer: Self-pay | Admitting: Obstetrics and Gynecology

## 2015-08-05 LAB — RPR: RPR Ser Ql: NONREACTIVE

## 2015-08-05 NOTE — Progress Notes (Signed)
Daily Post Partum Note  Brooke Anthony is a 35 y.o. G3P2003 PPD#1 s/p  SVD/1st (repaired)  @ [redacted]w[redacted]d.  Pregnancy c/b tobacco abuse and lack of PNC in the last month  24hr/overnight events:  none  Subjective:  Meeting all PP goals  Objective:    Current Vital Signs 24h Vital Sign Ranges  T 98.7 F (37.1 C) Temp  Avg: 98.3 F (36.8 C)  Min: 97.5 F (36.4 C)  Max: 98.7 F (37.1 C)  BP (!) 100/54 mmHg BP  Min: 100/54  Max: 132/64  HR 88 Pulse  Avg: 97.7  Min: 87  Max: 109  RR 18 Resp  Avg: 19.1  Min: 18  Max: 20  SaO2 100 % Not Delivered SpO2  Avg: 98.8 %  Min: 97 %  Max: 100 %       24 Hour I/O Current Shift I/O  Time Ins Outs 01/04 0701 - 01/05 0700 In: 1904.7 [P.O.:240; I.V.:1664.7] Out: 650 [Urine:400]      General: NAD Abdomen: FF below the umbilicus, nttp Perineum: deferred Skin:  Warm and dry.  Cardiovascular: Regular rate and rhythm. Respiratory:  Clear to auscultation bilateral. Normal respiratory effort Extremities: no c/c/e  Medications Current Facility-Administered Medications  Medication Dose Route Frequency Provider Last Rate Last Dose  . acetaminophen (TYLENOL) tablet 650 mg  650 mg Oral Q4H PRN [place] Bing, MD      . benzocaine-Menthol (DERMOPLAST) 20-0.5 % topical spray 1 application  1 application Topical PRN [place] Bing, MD      . witch hazel-glycerin (TUCKS) pad 1 application  1 application Topical PRN [place] Bing, MD       And  . dibucaine (NUPERCAINAL) 1 % rectal ointment 1 application  1 application Rectal PRN [place] Bing, MD      . diphenhydrAMINE (BENADRYL) capsule 25 mg  25 mg Oral Q6H PRN [place] Bing, MD      . docusate sodium (COLACE) capsule 100 mg  100 mg Oral BID PRN [place] Bing, MD      . ibuprofen (ADVIL,MOTRIN) tablet 600 mg  600 mg Oral 4 times per day [place] Bing, MD   600 mg at 08/05/15 0543  . lanolin ointment   Topical PRN [place] Bing, MD      . ondansetron (ZOFRAN) tablet 4 mg  4 mg Oral Q4H PRN  [place] Bing, MD       Or  . ondansetron (ZOFRAN) injection 4 mg  4 mg Intravenous Q4H PRN [place] Bing, MD      . oxyCODONE-acetaminophen (PERCOCET/ROXICET) 5-325 MG per tablet 1 tablet  1 tablet Oral Q6H PRN [place] Bing, MD   1 tablet at 08/04/15 2330  . oxytocin (PITOCIN) 40 Units in lactated ringers 1,000 mL (0.04 Units/mL) infusion  2.5 Units/hr Intravenous Continuous PRN [place] Bing, MD   Stopped at 08/04/15 1554  . prenatal multivitamin tablet 1 tablet  1 tablet Oral Q1200 [place] Bing, MD   1 tablet at 08/04/15 1207  . simethicone (MYLICON) chewable tablet 80 mg  80 mg Oral PRN  Bing, MD        Labs:  GBS pending  Assessment & Plan:  Pt doing well *Postpartum/postop: routine care *Dispo: tomorrow due to GBS unknown but no RFs  O POS / Rubella Immune / Varicella Immune/  RPR negative / HIV negative / HepBsAg negative / Tdap UTD: yes/Flu shot: yes/ pap unknown / Breast  / Contraception: IUD, no bridge / Follow up: [facility]  Cornelia Copa. MD [facility]  OBGYN Pager [phone]

## 2015-08-06 NOTE — Progress Notes (Signed)
Prenatal records indicate that pt received TDaP and Influenza vaccines on 05/11/15. Reynold BowenSusan Paisley Avanelle Pixley, RN 08/06/2015 3:26 PM

## 2015-08-06 NOTE — Progress Notes (Signed)
Discharge instructions provided.  Pt and sig other verbalize understanding of all instructions and follow-up care.  Pt discharged to home with infant at 1725 on 08/06/15 via wheelchair by volunteer. Reynold BowenSusan Paisley Terrionna Bridwell, RN 08/06/2015 8:06 PM

## 2015-08-07 LAB — CULTURE, BETA STREP (GROUP B ONLY)

## 2015-09-19 ENCOUNTER — Emergency Department: Payer: Medicaid Other

## 2015-09-19 ENCOUNTER — Encounter: Payer: Self-pay | Admitting: Emergency Medicine

## 2015-09-19 ENCOUNTER — Emergency Department
Admission: EM | Admit: 2015-09-19 | Discharge: 2015-09-19 | Disposition: A | Discharge status: Home / Self Care | Payer: Medicaid Other | Attending: Emergency Medicine | Admitting: Emergency Medicine

## 2015-09-19 DIAGNOSIS — W1843XA Slipping, tripping and stumbling without falling due to stepping from one level to another, initial encounter: Secondary | ICD-10-CM | POA: Insufficient documentation

## 2015-09-19 DIAGNOSIS — Y9389 Activity, other specified: Secondary | ICD-10-CM | POA: Diagnosis not present

## 2015-09-19 DIAGNOSIS — S99922A Unspecified injury of left foot, initial encounter: Secondary | ICD-10-CM | POA: Diagnosis present

## 2015-09-19 DIAGNOSIS — S93602A Unspecified sprain of left foot, initial encounter: Secondary | ICD-10-CM | POA: Insufficient documentation

## 2015-09-19 DIAGNOSIS — Z79899 Other long term (current) drug therapy: Secondary | ICD-10-CM | POA: Insufficient documentation

## 2015-09-19 DIAGNOSIS — F17210 Nicotine dependence, cigarettes, uncomplicated: Secondary | ICD-10-CM | POA: Insufficient documentation

## 2015-09-19 DIAGNOSIS — Y998 Other external cause status: Secondary | ICD-10-CM | POA: Diagnosis not present

## 2015-09-19 DIAGNOSIS — Y9289 Other specified places as the place of occurrence of the external cause: Secondary | ICD-10-CM | POA: Insufficient documentation

## 2015-09-19 MED ORDER — IBUPROFEN 800 MG PO TABS
800.0000 mg | ORAL_TABLET | Freq: Once | ORAL | Status: AC
  Administered 2015-09-19: 800 mg via ORAL
  Filled 2015-09-19: qty 1

## 2015-09-19 NOTE — ED Notes (Signed)
Pt says she mis-stepped this afternoon and injured the outer top of her left foot; painful to ambulate; denies any other injury

## 2015-09-19 NOTE — Discharge Instructions (Signed)
Foot Sprain °A foot sprain is an injury to one of the strong bands of tissue (ligaments) that connect and support the many bones in your feet. The ligament can be stretched too much or it can tear. A tear can be either partial or complete. The severity of the sprain depends on how much of the ligament was damaged or torn. °CAUSES °A foot sprain is usually caused by suddenly twisting or pivoting your foot. °RISK FACTORS °This injury is more likely to occur in people who: °· Play a sport, such as basketball or football. °· Exercise or play a sport without warming up. °· Start a new workout or sport. °· Suddenly increase how long or hard they exercise or play a sport. °SYMPTOMS °Symptoms of this condition start soon after an injury and include: °· Pain, especially in the arch of the foot. °· Bruising. °· Swelling. °· Inability to walk or use the foot to support body weight. °DIAGNOSIS °This condition is diagnosed with a medical history and physical exam. You may also have imaging tests, such as: °· X-rays to make sure there are no broken bones (fractures). °· MRI to see if the ligament has torn. °TREATMENT °Treatment varies depending on the severity of your sprain. Mild sprains can be treated with rest, ice, compression, and elevation (RICE). If your ligament is overstretched or partially torn, treatment usually involves keeping your foot in a fixed position (immobilization) for a period of time. To help you do this, your health care provider will apply a bandage, splint, or walking boot to keep your foot from moving until it heals. You may also be advised to use crutches or a scooter for a few weeks to avoid bearing weight on your foot while it is healing. °If your ligament is fully torn, you may need surgery to reconnect the ligament to the bone. After surgery, a cast or splint will be applied and will need to stay on your foot while it heals. °Your health care provider may also suggest exercises or physical therapy  to strengthen your foot. °HOME CARE INSTRUCTIONS °If You Have a Bandage, Splint, or Walking Boot: °· Wear it as directed by your health care provider. Remove it only as directed by your health care provider. °· Loosen the bandage, splint, or walking boot if your toes become numb and tingle, or if they turn cold and blue. °Bathing °· If your health care provider approves bathing and showering, cover the bandage or splint with a watertight plastic bag to protect it from water. Do not let the bandage or splint get wet. °Managing Pain, Stiffness, and Swelling  °· If directed, apply ice to the injured area: °¨ Put ice in a plastic bag. °¨ Place a towel between your skin and the bag. °¨ Leave the ice on for 20 minutes, 2-3 times per day. °· Move your toes often to avoid stiffness and to lessen swelling. °· Raise (elevate) the injured area above the level of your heart while you are sitting or lying down. °Driving °· Do not drive or operate heavy machinery while taking pain medicine. °· Do not drive while wearing a bandage, splint, or walking boot on a foot that you use for driving. °Activity °· Rest as directed by your health care provider. °· Do not use the injured foot to support your body weight until your health care provider says that you can. Use crutches or other supportive devices as directed by your health care provider. °· Ask your health care   provider what activities are safe for you. Gradually increase how much and how far you walk until your health care provider says it is safe to return to full activity. °· Do any exercise or physical therapy as directed by your health care provider. °General Instructions °· If a splint was applied, do not put pressure on any part of it until it is fully hardened. This may take several hours. °· Take medicines only as directed by your health care provider. These include over-the-counter medicines and prescription medicines. °· Keep all follow-up visits as directed by your  health care provider. This is important. °· When you can walk without pain, wear supportive shoes that have stiff soles. Do not wear flip-flops, and do not walk barefoot. °SEEK MEDICAL CARE IF: °· Your pain is not controlled with medicine. °· Your bruising or swelling gets worse or does not get better with treatment. °· Your splint or walking boot is damaged. °SEEK IMMEDIATE MEDICAL CARE IF: °· Your foot is numb or blue. °· Your foot feels colder than normal. °  °This information is not intended to replace advice given to you by your health care provider. Make sure you discuss any questions you have with your health care provider. °  °Document Released: 01/06/2002 Document Revised: 12/01/2014 Document Reviewed: 05/20/2014 °Elsevier Interactive Patient Education ©2016 [employer] ° °Elastic Bandage and RICE °WHAT DOES AN ELASTIC BANDAGE DO? °Elastic bandages come in different shapes and sizes. They generally provide support to your injury and reduce swelling while you are healing, but they can perform different functions. Your health care provider will help you to decide what is best for your protection, recovery, or rehabilitation following an injury. °WHAT ARE SOME GENERAL TIPS FOR USING AN ELASTIC BANDAGE? °· Use the bandage as directed by the maker of the bandage that you are using. °· Do not wrap the bandage too tightly. This may cut off the circulation in the arm or leg in the area below the bandage. °¨ If part of your body beyond the bandage becomes blue, numb, cold, swollen, or is more painful, your bandage is most likely too tight. If this occurs, remove your bandage and reapply it more loosely. °· See your health care provider if the bandage seems to be making your problems worse rather than better. °· An elastic bandage should be removed and reapplied every 3-4 hours or as directed by your health care provider. °WHAT IS RICE? °The routine care of many injuries includes rest, ice, compression, and  elevation (RICE therapy).  °Rest °Rest is required to allow your body to heal. Generally, you can resume your routine activities when you are comfortable and have been given permission by your health care provider. °Ice °Icing your injury helps to keep the swelling down and it reduces pain. Do not apply ice directly to your skin. °· Put ice in a plastic bag. °· Place a towel between your skin and the bag. °· Leave the ice on for 20 minutes, 2-3 times per day. °Do this for as long as you are directed by your health care provider. °Compression °Compression helps to keep swelling down, gives support, and helps with discomfort. Compression may be done with an elastic bandage. °Elevation °Elevation helps to reduce swelling and it decreases pain. If possible, your injured area should be placed at or above the level of your heart or the center of your chest. °WHEN SHOULD I SEEK MEDICAL CARE? °You should seek medical care if: °· You have persistent   pain and swelling.  Your symptoms are getting worse rather than improving. These symptoms may indicate that further evaluation or further X-rays are needed. Sometimes, X-rays may not show a small broken bone (fracture) until a number of days later. Make a follow-up appointment with your health care provider. Ask when your X-ray results will be ready. Make sure that you get your X-ray results. WHEN SHOULD I SEEK IMMEDIATE MEDICAL CARE? You should seek immediate medical care if:  You have a sudden onset of severe pain at or below the area of your injury.  You develop redness or increased swelling around your injury.  You have tingling or numbness at or below the area of your injury that does not improve after you remove the elastic bandage.   This information is not intended to replace advice given to you by your health care provider. Make sure you discuss any questions you have with your health care provider.   Document Released: 01/06/2002 Document Revised:  04/07/2015 Document Reviewed: 03/02/2014 Elsevier Interactive Patient Education 2016 [employer].  Take OTC ibuprofen as needed. Rest, ice, and elevate the foot for comfort.

## 2015-09-19 NOTE — ED Provider Notes (Signed)
[facility] Emergency Department Provider Note ____________________________________________  Time seen: 1924  I have reviewed the triage vital signs and the nursing notes.  HISTORY  Chief Complaint  Foot Pain HPI Brooke Anthony is a 35 y.o. female presents to the ED for evaluation of injury sustained this afternoon to her left foot. She describes she miss stepped and treated injury to her left foot and localizes pain to the dorsolateral aspect of her left foot. She denies a fall or any other injury at this time.She rates the discomfort 7/10 in triage.  Past Medical History  Diagnosis Date  . Medical history non-contributory     Patient Active Problem List   Diagnosis Date Noted  . Postpartum care following vaginal delivery 08/06/2015  . Labor and delivery, indication for care 08/04/2015  . Echogenic intracardiac focus of fetus on prenatal ultrasound 05/06/2015    Past Surgical History  Procedure Laterality Date  . No past surgeries      Current Outpatient Rx  Name  Route  Sig  Dispense  Refill  . Multiple Vitamin (MULTIVITAMIN) capsule   Oral   Take 1 capsule by mouth daily.         . Prenatal Vit-Fe Fumarate-FA (PRENATAL MULTIVITAMIN) TABS tablet   Oral   Take 1 tablet by mouth daily at 12 noon.          Allergies Review of patient's allergies indicates no known allergies.  History reviewed. No pertinent family history.  Social History Social History  Substance Use Topics  . Smoking status: Current Every Day Smoker -- 0.50 packs/day    Types: Cigarettes  . Smokeless tobacco: Never Used  . Alcohol Use: No   Review of Systems  Constitutional: Negative for fever. Cardiovascular: Negative for chest pain. Respiratory: Negative for shortness of breath. Musculoskeletal: Negative for back pain. Left foot pain as above Skin: Negative for rash. Neurological: Negative for headaches, focal weakness or  numbness. ____________________________________________  PHYSICAL EXAM:  VITAL SIGNS: ED Triage Vitals  Enc Vitals Group     BP 09/19/15 1855 112/66 mmHg     Pulse Rate 09/19/15 1855 95     Resp 09/19/15 1855 18     Temp 09/19/15 1855 98.3 F (36.8 C)     Temp Source 09/19/15 1855 Oral     SpO2 09/19/15 1855 99 %     Weight 09/19/15 1855 143 lb (64.864 kg)     Height 09/19/15 1855  (1.549 m)     Head Cir --      Peak Flow --      Pain Score 09/19/15 1902 7     Pain Loc --      Pain Edu? --      Excl. in GC? --    Constitutional: Alert and oriented. Well appearing and in no distress. Head: Normocephalic and atraumatic. Eyes: Conjunctivae are normal. PERRL. Normal extraocular movements Hematological/Lymphatic/Immunological: No cervical lymphadenopathy. Cardiovascular: Normal rate, regular rhythm. Normal DP and PT pulses distally. Respiratory: Normal respiratory effort.  Musculoskeletal: Left foot without obvious deformity, edema, effusion, or dislocation. Patient with normal ankle range of motion exam. No calf or Achilles tenderness is appreciated. She is minimal tender palpation over the lateral aspect of the left foot. Normal ankle exam with a negative drawer test is appreciated. Nontender with normal range of motion in all extremities.  Neurologic:  Normal gait without ataxia. Normal speech and language. No gross focal neurologic deficits are appreciated. Skin:  Skin is warm, dry  and intact. No rash noted. Psychiatric: Mood and affect are normal. Patient exhibits appropriate insight and judgment. ____________________________________________   RADIOLOGY  Left Foot IMPRESSION: Negative exam.  I, [name], Charlesetta Ivory, personally viewed and evaluated these images (plain radiographs) as part of my medical decision making, as well as reviewing the written report by the radiologist. ____________________________________________  PROCEDURES  Ace bandage Post-Op  shoe IBU 800 mg PO ____________________________________________  INITIAL IMPRESSION / ASSESSMENT AND PLAN / ED COURSE  Patient with an acute foot sprain without evidence of fracture dislocation. She is fitted with an Ace wrap and a postop shoe for comfort and support. She will follow-up with her primary care provider or Dr. Hyacinth Meeker for ongoing symptom management.RICE instructions are provided. ____________________________________________  FINAL CLINICAL IMPRESSION(S) / ED DIAGNOSES  Final diagnoses:  Foot sprain, left, initial encounter      Lissa Hoard, PA-C 09/19/15 1958  Phineas Semen, MD 09/19/15 2017

## 2016-06-12 ENCOUNTER — Observation Stay
Admission: EM | Admit: 2016-06-12 | Discharge: 2016-06-13 | Disposition: A | Discharge status: Home / Self Care | Payer: Medicaid Other | Attending: Internal Medicine | Admitting: Internal Medicine

## 2016-06-12 ENCOUNTER — Encounter: Payer: Self-pay | Admitting: Emergency Medicine

## 2016-06-12 ENCOUNTER — Emergency Department: Payer: Medicaid Other

## 2016-06-12 DIAGNOSIS — J189 Pneumonia, unspecified organism: Secondary | ICD-10-CM | POA: Diagnosis present

## 2016-06-12 DIAGNOSIS — F17210 Nicotine dependence, cigarettes, uncomplicated: Secondary | ICD-10-CM | POA: Diagnosis not present

## 2016-06-12 LAB — BASIC METABOLIC PANEL
ANION GAP: 9 (ref 5–15)
BUN: 17 mg/dL (ref 6–20)
CALCIUM: 9 mg/dL (ref 8.9–10.3)
CHLORIDE: 106 mmol/L (ref 101–111)
CO2: 21 mmol/L — AB (ref 22–32)
CREATININE: 0.63 mg/dL (ref 0.44–1.00)
GFR calc non Af Amer: >60 mL/min (ref >60)
GLUCOSE: 103 mg/dL — AB (ref 65–99)
POTASSIUM: 3.8 mmol/L (ref 3.5–5.1)
SODIUM: 136 mmol/L (ref 135–145)

## 2016-06-12 LAB — CBC
HCT: 42.4 % (ref 35.0–47.0)
HEMOGLOBIN: 14.4 g/dL (ref 12.0–16.0)
MCH: 30.5 pg (ref 26.0–34.0)
MCHC: 33.9 g/dL (ref 32.0–36.0)
MCV: 90.2 fL (ref 80.0–100.0)
PLATELETS: 260 10*3/uL (ref 150–440)
RBC: 4.7 MIL/uL (ref 3.80–5.20)
RDW: 13.1 % (ref 11.5–14.5)
WBC: 27.9 10*3/uL — ABNORMAL HIGH (ref 3.6–11.0)

## 2016-06-12 LAB — TROPONIN I

## 2016-06-12 NOTE — ED Triage Notes (Signed)
Patient ambulatory to triage with steady gait, without difficulty or distress noted; pt reports right sided CP, nonradiating last several days; st productive cough green sputum; st hx same and dx URI

## 2016-06-13 ENCOUNTER — Encounter: Payer: Self-pay | Admitting: Radiology

## 2016-06-13 ENCOUNTER — Emergency Department: Payer: Medicaid Other

## 2016-06-13 DIAGNOSIS — J189 Pneumonia, unspecified organism: Secondary | ICD-10-CM | POA: Diagnosis present

## 2016-06-13 LAB — URINALYSIS COMPLETE WITH MICROSCOPIC (ARMC ONLY)
Bilirubin Urine: NEGATIVE
GLUCOSE, UA: NEGATIVE mg/dL
HGB URINE DIPSTICK: NEGATIVE
LEUKOCYTES UA: NEGATIVE
NITRITE: NEGATIVE
Protein, ur: NEGATIVE mg/dL
SPECIFIC GRAVITY, URINE: 1.015 (ref 1.005–1.030)
pH: 6 (ref 5.0–8.0)

## 2016-06-13 LAB — CBC
HCT: 37.5 % (ref 35.0–47.0)
HEMOGLOBIN: 12.6 g/dL (ref 12.0–16.0)
MCH: 30.6 pg (ref 26.0–34.0)
MCHC: 33.5 g/dL (ref 32.0–36.0)
MCV: 91.1 fL (ref 80.0–100.0)
PLATELETS: 235 10*3/uL (ref 150–440)
RBC: 4.11 MIL/uL (ref 3.80–5.20)
RDW: 13.2 % (ref 11.5–14.5)
WBC: 23.4 10*3/uL — ABNORMAL HIGH (ref 3.6–11.0)

## 2016-06-13 LAB — BASIC METABOLIC PANEL
Anion gap: 6 (ref 5–15)
BUN: 11 mg/dL (ref 6–20)
CALCIUM: 8.4 mg/dL — AB (ref 8.9–10.3)
CHLORIDE: 107 mmol/L (ref 101–111)
CO2: 25 mmol/L (ref 22–32)
CREATININE: 0.65 mg/dL (ref 0.44–1.00)
GFR calc Af Amer: >60 mL/min (ref >60)
GFR calc non Af Amer: >60 mL/min (ref >60)
GLUCOSE: 103 mg/dL — AB (ref 65–99)
Potassium: 4.1 mmol/L (ref 3.5–5.1)
Sodium: 138 mmol/L (ref 135–145)

## 2016-06-13 LAB — POCT PREGNANCY, URINE: Preg Test, Ur: NEGATIVE

## 2016-06-13 LAB — LACTIC ACID, PLASMA: LACTIC ACID, VENOUS: 0.8 mmol/L (ref 0.5–1.9)

## 2016-06-13 MED ORDER — IOPAMIDOL (ISOVUE-370) INJECTION 76%
75.0000 mL | Freq: Once | INTRAVENOUS | Status: AC | PRN
  Administered 2016-06-13: 75 mL via INTRAVENOUS

## 2016-06-13 MED ORDER — LEVOFLOXACIN 750 MG PO TABS: 750.0000 mg | ORAL_TABLET | Freq: Every day | ORAL | 0 refills | Status: DC

## 2016-06-13 MED ORDER — MAGNESIUM CITRATE PO SOLN
1.0000 | Freq: Once | ORAL | Status: DC | PRN
  Filled 2016-06-13: qty 296

## 2016-06-13 MED ORDER — TRAMADOL HCL 50 MG PO TABS: 50.0000 mg | ORAL_TABLET | Freq: Four times a day (QID) | ORAL | 0 refills | Status: DC | PRN

## 2016-06-13 MED ORDER — ADULT MULTIVITAMIN W/MINERALS CH
1.0000 | ORAL_TABLET | Freq: Every day | ORAL | Status: DC
  Administered 2016-06-13: 11:00:00 1 via ORAL
  Filled 2016-06-13: qty 1

## 2016-06-13 MED ORDER — INFLUENZA VAC SPLIT QUAD 0.5 ML IM SUSY
0.5000 mL | PREFILLED_SYRINGE | Freq: Once | INTRAMUSCULAR | Status: AC
  Administered 2016-06-13: 0.5 mL via INTRAMUSCULAR
  Filled 2016-06-13: qty 0.5

## 2016-06-13 MED ORDER — DEXTROMETHORPHAN POLISTIREX ER 30 MG/5ML PO SUER
30.0000 mg | Freq: Two times a day (BID) | ORAL | Status: DC
  Administered 2016-06-13: 30 mg via ORAL
  Filled 2016-06-13 (×2): qty 5

## 2016-06-13 MED ORDER — IPRATROPIUM-ALBUTEROL 0.5-2.5 (3) MG/3ML IN SOLN: 3.0000 mL | Freq: Four times a day (QID) | RESPIRATORY_TRACT | Status: DC | PRN

## 2016-06-13 MED ORDER — ACETAMINOPHEN 650 MG RE SUPP: 650.0000 mg | Freq: Four times a day (QID) | RECTAL | Status: DC | PRN

## 2016-06-13 MED ORDER — KETOROLAC TROMETHAMINE 30 MG/ML IJ SOLN: 30.0000 mg | Freq: Three times a day (TID) | INTRAMUSCULAR | Status: DC | PRN

## 2016-06-13 MED ORDER — ONDANSETRON HCL 4 MG/2ML IJ SOLN: 4.0000 mg | Freq: Four times a day (QID) | INTRAMUSCULAR | Status: DC | PRN

## 2016-06-13 MED ORDER — DEXTROSE 5 % IV SOLN: 1.0000 g | Freq: Once | INTRAVENOUS | Status: DC

## 2016-06-13 MED ORDER — HYDROCODONE-HOMATROPINE 5-1.5 MG/5ML PO SYRP
5.0000 mL | ORAL_SOLUTION | ORAL | Status: DC | PRN
  Administered 2016-06-13 (×2): 5 mL via ORAL
  Filled 2016-06-13 (×2): qty 5

## 2016-06-13 MED ORDER — SODIUM CHLORIDE 0.9 % IV SOLN
INTRAVENOUS | Status: DC
  Administered 2016-06-13 (×2): via INTRAVENOUS

## 2016-06-13 MED ORDER — GUAIFENESIN ER 600 MG PO TB12
600.0000 mg | ORAL_TABLET | Freq: Two times a day (BID) | ORAL | Status: DC
  Administered 2016-06-13: 600 mg via ORAL
  Filled 2016-06-13: qty 1

## 2016-06-13 MED ORDER — DEXTROSE 5 % IV SOLN
500.0000 mg | Freq: Once | INTRAVENOUS | Status: AC
  Administered 2016-06-13: 500 mg via INTRAVENOUS
  Filled 2016-06-13: qty 500

## 2016-06-13 MED ORDER — DEXTROSE 5 % IV SOLN: 500.0000 mg | INTRAVENOUS | Status: DC

## 2016-06-13 MED ORDER — BISACODYL 5 MG PO TBEC: 5.0000 mg | DELAYED_RELEASE_TABLET | Freq: Every day | ORAL | Status: DC | PRN

## 2016-06-13 MED ORDER — PRENATAL MULTIVITAMIN CH
1.0000 | ORAL_TABLET | Freq: Every day | ORAL | Status: DC
  Filled 2016-06-13: qty 1

## 2016-06-13 MED ORDER — ACETAMINOPHEN 325 MG PO TABS: 650.0000 mg | ORAL_TABLET | Freq: Four times a day (QID) | ORAL | Status: DC | PRN

## 2016-06-13 MED ORDER — ONDANSETRON HCL 4 MG PO TABS: 4.0000 mg | ORAL_TABLET | Freq: Four times a day (QID) | ORAL | Status: DC | PRN

## 2016-06-13 MED ORDER — ZOLPIDEM TARTRATE 5 MG PO TABS: 5.0000 mg | ORAL_TABLET | Freq: Every evening | ORAL | Status: DC | PRN

## 2016-06-13 MED ORDER — INFLUENZA VAC SPLIT QUAD 0.5 ML IM SUSY
0.5000 mL | PREFILLED_SYRINGE | INTRAMUSCULAR | Status: DC
Start: 2016-06-14 — End: 2016-06-13

## 2016-06-13 MED ORDER — PNEUMOCOCCAL VAC POLYVALENT 25 MCG/0.5ML IJ INJ
0.5000 mL | INJECTION | Freq: Once | INTRAMUSCULAR | Status: DC
  Filled 2016-06-13: qty 0.5

## 2016-06-13 MED ORDER — CEFTRIAXONE SODIUM-DEXTROSE 1-3.74 GM-% IV SOLR
1.0000 g | Freq: Once | INTRAVENOUS | Status: AC
  Administered 2016-06-13: 1 g via INTRAVENOUS
  Filled 2016-06-13: qty 50

## 2016-06-13 MED ORDER — CEFTRIAXONE SODIUM-DEXTROSE 1-3.74 GM-% IV SOLR: 1.0000 g | INTRAVENOUS | Status: DC

## 2016-06-13 MED ORDER — MULTIVITAMINS PO CAPS: 1.0000 | ORAL_CAPSULE | Freq: Every day | ORAL | Status: DC

## 2016-06-13 MED ORDER — DEXTROSE 5 % IV SOLN
500.0000 mg | INTRAVENOUS | Status: DC
  Filled 2016-06-13: qty 500

## 2016-06-13 MED ORDER — HYDROCODONE-ACETAMINOPHEN 5-325 MG PO TABS
1.0000 | ORAL_TABLET | ORAL | Status: DC | PRN
Start: 2016-06-13 — End: 2016-06-13
  Administered 2016-06-13 (×3): 1 via ORAL
  Filled 2016-06-13 (×3): qty 1

## 2016-06-13 MED ORDER — SENNOSIDES-DOCUSATE SODIUM 8.6-50 MG PO TABS: 1.0000 | ORAL_TABLET | Freq: Every evening | ORAL | Status: DC | PRN

## 2016-06-13 MED ORDER — DM-GUAIFENESIN ER 30-600 MG PO TB12: 1.0000 | ORAL_TABLET | Freq: Two times a day (BID) | ORAL | Status: DC

## 2016-06-13 NOTE — Progress Notes (Signed)
Pt is being discharged home. Discharge instructions given and explained to pt. Pt verbalized understanding. F/U appointment and meds reviewed with pt. RX given. Pt requested to schedule the f/u appointment herself. Per pt she needs to change insurance so the visit will be covered. Awaiting transportation.

## 2016-06-13 NOTE — H&P (Signed)
[facility] [place] @ [facility] Admission History and Physical [employer], D.O.  ---------------------------------------------------------------------------------------------------------------------   PATIENT NAME: Brooke Anthony MR#: [id number] DATE OF BIRTH: 1981-07-16 DATE OF ADMISSION: 06/12/2016 PRIMARY CARE PHYSICIAN: [facility] Department  REQUESTING/REFERRING PHYSICIAN: ED Dr. Cyril Loosen  CHIEF COMPLAINT: Chief Complaint  Patient presents with  . Chest Pain    HISTORY OF PRESENT ILLNESS: Brooke Anthony is a 35 y.o. female with No known past medical history presents to the emergency department complaining of cough, chest pain and shortness of breath.  Patient was in a usual state of health until 2 months ago when she developed upper and lower respiratory symptoms. She was placed on Augmentin by her primary care doctor however her symptoms have persisted. She recent started taking Mucinex on her own for severe cough became to the emergency department today because her cough became painful and associated with shortness of breath.. She reports chills but denies fever.  Otherwise there has been no change in status.   There has been no recent illness, travel, incarceration or sick contacts.    Patient denies weakness, dizziness, N/V/C/D, abdominal pain, dysuria/frequency, changes in mental status.   EMS/ED COURSE:   Patient received Rocephin and Zithromax.  PAST MEDICAL HISTORY: Past Medical History:  Diagnosis Date  . Medical history non-contributory       PAST SURGICAL HISTORY: Past Surgical History:  Procedure Laterality Date  . NO PAST SURGERIES        SOCIAL HISTORY: Social History  Substance Use Topics  . Smoking status: Current Every Day Smoker    Packs/day: 0.50    Types: Cigarettes  . Smokeless tobacco: Never Used  . Alcohol use No   LMP was last week.    FAMILY HISTORY: No family history on file.   MEDICATIONS AT  HOME: Prior to Admission medications   Medication Sig Start Date End Date Taking? Authorizing Provider  Multiple Vitamin (MULTIVITAMIN) capsule Take 1 capsule by mouth daily.    Historical Provider, MD  Prenatal Vit-Fe Fumarate-FA (PRENATAL MULTIVITAMIN) TABS tablet Take 1 tablet by mouth daily at 12 noon.    Historical Provider, MD      DRUG ALLERGIES: No Known Allergies   REVIEW OF SYSTEMS: CONSTITUTIONAL: No fatigue, weakness, fever, positive chills, negative weight gain/loss, headache EYES: No blurry or double vision. ENT: No tinnitus, postnasal drip, redness or soreness of the oropharynx. RESPIRATORY: Positive dyspnea, cough, no wheeze, hemoptysis. CARDIOVASCULAR: No chest pain, orthopnea, palpitations, syncope. GASTROINTESTINAL: No nausea, vomiting, constipation, diarrhea, abdominal pain. No hematemesis, melena or hematochezia. GENITOURINARY: No dysuria, frequency, hematuria. ENDOCRINE: No polyuria or nocturia. No heat or cold intolerance. HEMATOLOGY: No anemia, bruising, bleeding. INTEGUMENTARY: No rashes, ulcers, lesions. MUSCULOSKELETAL: No pain, arthritis, swelling, gout. NEUROLOGIC: No numbness, tingling, weakness or ataxia. No seizure-type activity. PSYCHIATRIC: No anxiety, depression, insomnia.  PHYSICAL EXAMINATION: VITAL SIGNS: Blood pressure (!) 111/94, pulse 95, temperature 98.9 F (37.2 C), temperature source Oral, resp. rate (!) 26, height 5\' 1"  (1.549 m), weight 61.2 kg (135 lb), last menstrual period 06/05/2016, SpO2 100 %, unknown if currently breastfeeding.  GENERAL: 35 y.o.-yefemale patient, well-developed, well-nourished lying in the bed in no acute distress.  patient is tearful secondary to pain with coughing.  HEENT: Head atraumatic, normocephalic. Pupils equal, round, reactive to light and accommodation. No scleral icterus. Extraocular muscles intact. Oropharynx is clear. Mucus membranes moist. NECK: Supple, full range of motion. No JVD, no bruit heard.  No cervical lymphadenopathy. CHEST:  patient has rales bilaterally worse at the  bases.No use of accessory muscles of respiration.  No reproducible chest wall tenderness.  CARDIOVASCULAR: S1, S2 normal. No murmurs, rubs, or gallops appreciated. Cap refill <2 seconds. ABDOMEN: Soft, nontender, nondistended. No rebound, guarding, rigidity. Normoactive bowel sounds present in all four quadrants. No organomegaly or mass. EXTREMITIES: Full range of motion. No pedal edema, cyanosis, or clubbing. NEUROLOGIC: Cranial nerves II through XII are grossly intact with no focal sensorimotor deficit. Muscle strength 5/5 in all extremities. Sensation intact. Gait not checked. PSYCHIATRIC: The patient is alert and oriented x 3. Normal affect, mood, thought content. SKIN: Warm, dry, and intact without obvious rash, lesion, or ulcer.  LABORATORY PANEL:  CBC  Recent Labs Lab 06/12/16 2040  WBC 27.9*  HGB 14.4  HCT 42.4  PLT 260   ----------------------------------------------------------------------------------------------------------------- Chemistries  Recent Labs Lab 06/12/16 2040  NA 136  K 3.8  CL 106  CO2 21*  GLUCOSE 103*  BUN 17  CREATININE 0.63  CALCIUM 9.0   ------------------------------------------------------------------------------------------------------------------ Cardiac Enzymes  Recent Labs Lab 06/12/16 2040  TROPONINI <0.03   ------------------------------------------------------------------------------------------------------------------  RADIOLOGY: Dg Chest 2 View  Result Date: 06/12/2016 CLINICAL DATA:  Acute onset of right-sided chest pain and productive cough. Initial encounter. EXAM: CHEST  2 VIEW COMPARISON:  Chest radiograph performed 07/06/2015 FINDINGS: The lungs are well-aerated. Minimal left basilar atelectasis or scarring is noted. There is no evidence of pleural effusion or pneumothorax. The heart is normal in size; the mediastinal contour is within  normal limits. No acute osseous abnormalities are seen. IMPRESSION: Minimal left basilar atelectasis or scarring noted. Lungs otherwise clear. Electronically Signed   By: Roanna RaiderJeffery  Chang M.D.   On: 06/12/2016 20:59   Ct Angio Chest Pe W And/or Wo Contrast  Result Date: 06/13/2016 CLINICAL DATA:  Right-sided chest pain for several days. Productive cough. EXAM: CT ANGIOGRAPHY CHEST WITH CONTRAST TECHNIQUE: Multidetector CT imaging of the chest was performed using the standard protocol during bolus administration of intravenous contrast. Multiplanar CT image reconstructions and MIPs were obtained to evaluate the vascular anatomy. CONTRAST:  75 mL Isovue 370 intravenous COMPARISON:  07/06/2015 FINDINGS: Cardiovascular: Satisfactory opacification of the pulmonary arteries to the segmental level. No evidence of pulmonary embolism. Normal heart size. No pericardial effusion. Mediastinum/Nodes: No enlarged mediastinal, hilar, or axillary lymph nodes. Thyroid gland, trachea, and esophagus demonstrate no significant findings. Lungs/Pleura: There are multifocal patchy airspace opacities in the lower lobes, as well as the right middle lobe base. This may represent pneumonia. Airways are patent. Unchanged 4 mm nodule at the anterior periphery of the right upper lobe, not requiring follow-up if the patient is at low risk. No pleural effusion. Upper Abdomen: No significant abnormality. Musculoskeletal: No significant abnormality. Review of the MIP images confirms the above findings. IMPRESSION: 1. Negative for acute pulmonary embolism. 2. There are multifocal patchy airspace opacities in the lower lobes, suspicious for pneumonia. Electronically Signed   By: Ellery Plunkaniel R Mitchell M.D.   On: 06/13/2016 01:00    EKG: Normal sinus rhythm at99 bpm with normal axis and nonspecific ST-T wave changes.   IMPRESSION AND PLAN:  This is a 35 y.o. female wino known past medical history now being admitted with: 1.Community-acquired  bacterial pneumonia, failed outpatient therapy-admit to observation for IV antibiotics, nebulizers, O2 therapy as needed antitussives, antipyretics, pain control. 2.  chest pain, pleuritic in secondary to #1-pain control   Diet/Nutrition:  regular Fluids:  IV normal saline DVT Px: SCDs and early ambulation Code Status: Full  All the records are reviewed and case  discussed with ED provider. Management plans discussed with the patient and/or family who express understanding and agree with plan of care.   TOTAL TIME TAKING CARE OF THIS PATIENT: 60 minutes.   Fawzi Melman D.O. on 06/13/2016 at 1:18 AM Between 7am to 6pm - Pager - [phone] After 6pm go to [email or web address] - Social research officer, governmentpassword EPAS [facility] Office [phone] CC: Primary care physician; [facility]     Note: This dictation was prepared with Dragon dictation along with smaller phrase technology. Any transcriptional errors that result from this process are unintentional.

## 2016-06-13 NOTE — Progress Notes (Addendum)
Pharmacy Antibiotic Note  Brooke Anthony is a 35 y.o. female admitted on 06/12/2016 with pneumonia.  Pharmacy has been consulted for ceftriaxone and azithromycin dosing.  Plan: Ceftriaxone 1 gram q 24 hours and azithromycin 500 mg q 24 hours ordered.  Height: 5\' 1"  (154.9 cm) Weight: 135 lb (61.2 kg) IBW/kg (Calculated) : 47.8  Temp (24hrs), Avg:98.9 F (37.2 C), Min:98.9 F (37.2 C), Max:98.9 F (37.2 C)   Recent Labs Lab 06/12/16 2040 06/13/16 0020  WBC 27.9*  --   CREATININE 0.63  --   LATICACIDVEN  --  0.8    Estimated Creatinine Clearance: 82.4 mL/min (by C-G formula based on SCr of 0.63 mg/dL).    No Known Allergies  Antimicrobials this admission: ceftriaxone 11/13 >>  azithromycin 11/13 >>   Dose adjustments this admission:   Microbiology results: 11/14 BCx: pending 11/14 UCx: pending     11/14 CT chest: lower lobe opacities, susp. PNA 11/14 UA: (-)    Thank you for allowing pharmacy to be a part of this patient's care.  Ponce Skillman S 06/13/2016 1:37 AM

## 2016-06-13 NOTE — Progress Notes (Signed)
While rounding, CH made initial visit to room 101. Pt was in good spirits.  Pt appreciated the visit, but was in need of the nurse. I will follow up on my next rounding.    06/13/16 1200  Clinical Encounter Type  Visited With Patient  Visit Type Initial;Spiritual support  Referral From Nurse

## 2016-06-13 NOTE — Progress Notes (Signed)
[facility] [place] [facility]        Brooke Anthony was admitted to the Hospital on 06/12/2016 and Discharged  06/13/2016 and should be excused from work/school   for 2 days starting 06/12/2016 , may return to work/school without any restrictions.  Call Adrian SaranSital Tanay Misuraca MD with questions.  [name] M.D on 06/13/2016,at 9:33 AM  [facility] [place] [facility]    Office  [phone]

## 2016-06-13 NOTE — Discharge Summary (Signed)
[facility] [place] at [facility]   PATIENT NAME: Brooke BrunsKaybelene Ngual    MR#:  [id number]  DATE OF BIRTH:  06-Aug-1980  DATE OF ADMISSION:  06/12/2016 ADMITTING PHYSICIAN: Tonye RoyaltyAlexis Hugelmeyer, DO  DATE OF DISCHARGE: 06/13/2016  PRIMARY CARE PHYSICIAN: [facility]    ADMISSION DIAGNOSIS:  Community acquired pneumonia, unspecified laterality [J18.9]  DISCHARGE DIAGNOSIS:  Active Problems:   Community acquired pneumonia   SECONDARY DIAGNOSIS:   Past Medical History:  Diagnosis Date  . Medical history non-contributory     HOSPITAL COURSE:   35 y.o. female wino known past medical history now being admitted with CAP.   1.Community-acquired bacterial pneumonia, failed outpatient therapy: She was Started on IV Rocephin and azithromycin. Her symptoms have improved. She is not hypoxic. Her white blood cell count is improving. She will be discharged on oral Levaquin for 5 more days.   2.  chest pain, pleuritic in nature: This is secondary to #1.  3. Tobacco dependence: Patient highly encouraged to stop smoking. Patient did not want a nicotine patch at discharge. Patient counseled 3 minutes.  DISCHARGE CONDITIONS AND DIET:   Stable for discharge on regular diet  CONSULTS OBTAINED:    DRUG ALLERGIES:  No Known Allergies  DISCHARGE MEDICATIONS:   Current Discharge Medication List    START taking these medications   Details  levofloxacin (LEVAQUIN) 750 MG tablet Take 1 tablet (750 mg total) by mouth daily. Qty: 5 tablet, Refills: 0    traMADol (ULTRAM) 50 MG tablet Take 1 tablet (50 mg total) by mouth every 6 (six) hours as needed for moderate pain. Qty: 10 tablet, Refills: 0      CONTINUE these medications which have NOT CHANGED   Details  Multiple Vitamin (MULTIVITAMIN) capsule Take 1 capsule by mouth daily.    Prenatal Vit-Fe Fumarate-FA (PRENATAL MULTIVITAMIN) TABS tablet Take 1 tablet by mouth daily at 12 noon.               Today   CHIEF COMPLAINT:  Patient has pain when she coughs however her symptoms improved since admission   VITAL SIGNS:  Blood pressure (!) 97/54, pulse 84, temperature 99.8 F (37.7 C), temperature source Oral, resp. rate 20, height 5\' 1"  (1.549 m), weight 61.7 kg (136 lb), last menstrual period 06/05/2016, SpO2 98 %, unknown if currently breastfeeding.   REVIEW OF SYSTEMS:  Review of Systems  Constitutional: Negative.  Negative for chills, fever and malaise/fatigue.  HENT: Negative.  Negative for ear discharge, ear pain, hearing loss, nosebleeds and sore throat.   Eyes: Negative.  Negative for blurred vision and pain.  Respiratory: Positive for cough. Negative for hemoptysis, shortness of breath and wheezing.        Pain when she coughs  Cardiovascular: Negative.  Negative for chest pain, palpitations and leg swelling.  Gastrointestinal: Negative.  Negative for abdominal pain, blood in stool, diarrhea, nausea and vomiting.  Genitourinary: Negative.  Negative for dysuria.  Musculoskeletal: Negative.  Negative for back pain.  Skin: Negative.   Neurological: Negative for dizziness, tremors, speech change, focal weakness, seizures and headaches.  Endo/Heme/Allergies: Negative.  Does not bruise/bleed easily.  Psychiatric/Behavioral: Negative.  Negative for depression, hallucinations and suicidal ideas.     PHYSICAL EXAMINATION:  GENERAL:  35 y.o.-year-old patient lying in the bed with no acute distress.  NECK:  Supple, no jugular venous distention. No thyroid enlargement, no tenderness.  LUNGS: b/l crackles at bases NO WHEEZING or rhonchi  No use of accessory  muscles of respiration.  CARDIOVASCULAR: S1, S2 normal. No murmurs, rubs, or gallops.  ABDOMEN: Soft, non-tender, non-distended. Bowel sounds present. No organomegaly or mass.  EXTREMITIES: No pedal edema, cyanosis, or clubbing.  PSYCHIATRIC: The patient is alert and oriented x 3.  SKIN: No obvious rash,  lesion, or ulcer.   DATA REVIEW:   CBC  Recent Labs Lab 06/13/16 0557  WBC 23.4*  HGB 12.6  HCT 37.5  PLT 235    Chemistries   Recent Labs Lab 06/13/16 0557  NA 138  K 4.1  CL 107  CO2 25  GLUCOSE 103*  BUN 11  CREATININE 0.65  CALCIUM 8.4*    Cardiac Enzymes  Recent Labs Lab 06/12/16 2040  TROPONINI <0.03    Microbiology Results  @[email or web address] @  RADIOLOGY:  Dg Chest 2 View  Result Date: 06/12/2016 CLINICAL DATA:  Acute onset of right-sided chest pain and productive cough. Initial encounter. EXAM: CHEST  2 VIEW COMPARISON:  Chest radiograph performed 07/06/2015 FINDINGS: The lungs are well-aerated. Minimal left basilar atelectasis or scarring is noted. There is no evidence of pleural effusion or pneumothorax. The heart is normal in size; the mediastinal contour is within normal limits. No acute osseous abnormalities are seen. IMPRESSION: Minimal left basilar atelectasis or scarring noted. Lungs otherwise clear. Electronically Signed   By: Roanna RaiderJeffery  Chang M.D.   On: 06/12/2016 20:59   Ct Angio Chest Pe W And/or Wo Contrast  Result Date: 06/13/2016 CLINICAL DATA:  Right-sided chest pain for several days. Productive cough. EXAM: CT ANGIOGRAPHY CHEST WITH CONTRAST TECHNIQUE: Multidetector CT imaging of the chest was performed using the standard protocol during bolus administration of intravenous contrast. Multiplanar CT image reconstructions and MIPs were obtained to evaluate the vascular anatomy. CONTRAST:  75 mL Isovue 370 intravenous COMPARISON:  07/06/2015 FINDINGS: Cardiovascular: Satisfactory opacification of the pulmonary arteries to the segmental level. No evidence of pulmonary embolism. Normal heart size. No pericardial effusion. Mediastinum/Nodes: No enlarged mediastinal, hilar, or axillary lymph nodes. Thyroid gland, trachea, and esophagus demonstrate no significant findings. Lungs/Pleura: There are multifocal patchy airspace opacities in the lower lobes, as  well as the right middle lobe base. This may represent pneumonia. Airways are patent. Unchanged 4 mm nodule at the anterior periphery of the right upper lobe, not requiring follow-up if the patient is at low risk. No pleural effusion. Upper Abdomen: No significant abnormality. Musculoskeletal: No significant abnormality. Review of the MIP images confirms the above findings. IMPRESSION: 1. Negative for acute pulmonary embolism. 2. There are multifocal patchy airspace opacities in the lower lobes, suspicious for pneumonia. Electronically Signed   By: Ellery Plunkaniel R Mitchell M.D.   On: 06/13/2016 01:00      Management plans discussed with the patient and she is in agreement. Stable for discharge home  Patient should follow up with pcp  CODE STATUS:     Code Status Orders        Start     Ordered   06/13/16 0202  Full code  Continuous     06/13/16 0201    Code Status History    Date Active Date Inactive Code Status Order ID Comments User Context   08/04/2015 11:53 AM 08/06/2015 11:12 PM Full Code [id number]  [place] Bingharlie Pickens, MD Inpatient   08/04/2015  6:32 AM 08/04/2015 11:53 AM Full Code [id number]  Conard NovakStephen D Jackson, MD Inpatient      TOTAL TIME TAKING CARE OF THIS PATIENT: 36 minutes.    Note: This dictation was prepared with Dragon dictation along  with smaller phrase technology. Any transcriptional errors that result from this process are unintentional.  Tokiko Diefenderfer M.D on 06/13/2016 at 9:28 AM  Between 7am to 6pm - Pager - [phone] After 6pm go to [email or web address] - Social research officer, government  [facility] [place] [facility]  Office  [phone]  CC: Primary care physician; [facility]

## 2016-06-13 NOTE — ED Provider Notes (Signed)
[facility] Emergency Department Provider Note   ____________________________________________    I have reviewed the triage vital signs and the nursing notes.   HISTORY  Chief Complaint Chest Pain     HPI Brooke Anthony is a 35 y.o. female who presents with complaints of cough, chest pain for several weeks. Patient reports she was treated with antibiotics by her PCP but did not improve. She denies recent travel. She denies calf pain or swelling. She is on hormonal birth control. No history of blood clots. Denies fevers, does report chills. No sick contacts reported.   Past Medical History:  Diagnosis Date  . Medical history non-contributory     Patient Active Problem List   Diagnosis Date Noted  . Postpartum care following vaginal delivery 08/06/2015  . Labor and delivery, indication for care 08/04/2015  . Echogenic intracardiac focus of fetus on prenatal ultrasound 05/06/2015    Past Surgical History:  Procedure Laterality Date  . NO PAST SURGERIES      Prior to Admission medications   Medication Sig Start Date End Date Taking? Authorizing Provider  Multiple Vitamin (MULTIVITAMIN) capsule Take 1 capsule by mouth daily.    Historical Provider, MD  Prenatal Vit-Fe Fumarate-FA (PRENATAL MULTIVITAMIN) TABS tablet Take 1 tablet by mouth daily at 12 noon.    Historical Provider, MD     Allergies Patient has no known allergies.  No family history on file.  Social History Social History  Substance Use Topics  . Smoking status: Current Every Day Smoker    Packs/day: 0.50    Types: Cigarettes  . Smokeless tobacco: Never Used  . Alcohol use No    Review of Systems  Constitutional: As above Eyes: No visual changes.  ENT: No sore throat. Cardiovascular: As above Respiratory: Productive cough as above Gastrointestinal: No abdominal pain.  No nausea, no vomiting.   Genitourinary: Negative for dysuria. Musculoskeletal: Negative for  back pain. Skin: Negative for rash. Neurological: Negative for headaches   10-point ROS otherwise negative.  ____________________________________________   PHYSICAL EXAM:  VITAL SIGNS: ED Triage Vitals  Enc Vitals Group     BP 06/12/16 2035 110/66     Pulse Rate 06/12/16 2035 (!) 106     Resp 06/12/16 2035 20     Temp 06/12/16 2035 98.9 F (37.2 C)     Temp Source 06/12/16 2035 Oral     SpO2 06/12/16 2035 96 %     Weight 06/12/16 2038 135 lb (61.2 kg)     Height 06/12/16 2038 5\' 1"  (1.549 m)     Head Circumference --      Peak Flow --      Pain Score 06/12/16 2037 2     Pain Loc --      Pain Edu? --      Excl. in GC? --     Constitutional: Alert and oriented. No acute distress.  Eyes: Conjunctivae are normal.  Head: Atraumatic. Nose: No congestion/rhinnorhea. Mouth/Throat: Mucous membranes are moist.    Cardiovascular: Normal rate, regular rhythm. Grossly normal heart sounds.  Good peripheral circulation. Respiratory: Normal respiratory effort.  No retractions.Bibasilar rales Gastrointestinal: Soft and nontender. No distention.  No CVA tenderness. Genitourinary: deferred Musculoskeletal: No lower extremity tenderness nor edema.  Warm and well perfused Neurologic:  Normal speech and language. No gross focal neurologic deficits are appreciated.  Skin:  Skin is warm, dry and intact. No rash noted. Psychiatric: Mood and affect are normal. Speech and behavior are normal.  ____________________________________________   LABS (all labs ordered are listed, but only abnormal results are displayed)  Labs Reviewed  BASIC METABOLIC PANEL - Abnormal; Notable for the following:       Result Value   CO2 21 (*)    Glucose, Bld 103 (*)    All other components within normal limits  CBC - Abnormal; Notable for the following:    WBC 27.9 (*)    All other components within normal limits  CULTURE, BLOOD (ROUTINE X 2)  CULTURE, BLOOD (ROUTINE X 2)  URINE CULTURE  TROPONIN I    LACTIC ACID, PLASMA  LACTIC ACID, PLASMA  URINALYSIS COMPLETEWITH MICROSCOPIC ([facility] ONLY)  POCT PREGNANCY, URINE   ____________________________________________  EKG  ED ECG REPORT I, Jene EveryKINNER, Ronnie Doo, the attending physician, personally viewed and interpreted this ECG.  Date: 06/13/2016 EKG Time: 8:40 PM Rate: 99 Rhythm: normal sinus rhythm QRS Axis: normal Intervals: normal ST/T Wave abnormalities: normal Conduction Disturbances: none Narrative Interpretation: unremarkable  ____________________________________________  RADIOLOGY  CT angiography of the chest suggests pneumonia ____________________________________________   PROCEDURES  Procedure(s) performed: No    Critical Care performed: No ____________________________________________   INITIAL IMPRESSION / ASSESSMENT AND PLAN / ED COURSE  Pertinent labs & imaging results that were available during my care of the patient were reviewed by me and considered in my medical decision making (see chart for details).  Patient presents with chest discomfort, cough which is productive. She is afebrile here. Given the prolonged course and lack of response to antibiotics and description of pleurisy, PE is a concern however bronchitis and pneumonia are also possible. We will obtain CT imaging of the chest  Clinical Course   CT scan suggests pneumonia, elevated white blood cell count, we'll start IV antibiotics and admitted to the hospital ____________________________________________   FINAL CLINICAL IMPRESSION(S) / ED DIAGNOSES  Final diagnoses:  Community acquired pneumonia, unspecified laterality      NEW MEDICATIONS STARTED DURING THIS VISIT:  New Prescriptions   No medications on file     Note:  This document was prepared using Dragon voice recognition software and may include unintentional dictation errors.    [name], MD 06/13/16 [phone]

## 2016-06-13 NOTE — ED Notes (Signed)
Report given to floor RN. Pt taken to floor via wheelchair. Vital signs stable prior to transport.

## 2016-06-15 LAB — URINE CULTURE

## 2016-06-18 LAB — CULTURE, BLOOD (ROUTINE X 2)
CULTURE: NO GROWTH
Culture: NO GROWTH

## 2017-09-03 ENCOUNTER — Emergency Department
Admission: EM | Admit: 2017-09-03 | Discharge: 2017-09-03 | Disposition: A | Discharge status: Home / Self Care | Payer: No Typology Code available for payment source | Attending: Emergency Medicine | Admitting: Emergency Medicine

## 2017-09-03 ENCOUNTER — Other Ambulatory Visit: Payer: Self-pay

## 2017-09-03 ENCOUNTER — Emergency Department: Payer: No Typology Code available for payment source

## 2017-09-03 ENCOUNTER — Encounter: Payer: Self-pay | Admitting: Emergency Medicine

## 2017-09-03 DIAGNOSIS — R0789 Other chest pain: Secondary | ICD-10-CM | POA: Diagnosis not present

## 2017-09-03 DIAGNOSIS — Z79899 Other long term (current) drug therapy: Secondary | ICD-10-CM | POA: Insufficient documentation

## 2017-09-03 DIAGNOSIS — R1032 Left lower quadrant pain: Secondary | ICD-10-CM | POA: Diagnosis present

## 2017-09-03 DIAGNOSIS — F17210 Nicotine dependence, cigarettes, uncomplicated: Secondary | ICD-10-CM | POA: Insufficient documentation

## 2017-09-03 LAB — CBC
HCT: 44.1 % (ref 35.0–47.0)
Hemoglobin: 15.1 g/dL (ref 12.0–16.0)
MCH: 31.1 pg (ref 26.0–34.0)
MCHC: 34.3 g/dL (ref 32.0–36.0)
MCV: 90.7 fL (ref 80.0–100.0)
PLATELETS: 197 10*3/uL (ref 150–440)
RBC: 4.87 MIL/uL (ref 3.80–5.20)
RDW: 12.9 % (ref 11.5–14.5)
WBC: 7.8 10*3/uL (ref 3.6–11.0)

## 2017-09-03 LAB — LIPASE, BLOOD: Lipase: 48 U/L (ref 11–51)

## 2017-09-03 LAB — POCT PREGNANCY, URINE: Preg Test, Ur: NEGATIVE

## 2017-09-03 LAB — URINALYSIS, COMPLETE (UACMP) WITH MICROSCOPIC
Bilirubin Urine: NEGATIVE
GLUCOSE, UA: NEGATIVE mg/dL
Ketones, ur: 5 mg/dL — AB
Leukocytes, UA: NEGATIVE
NITRITE: POSITIVE — AB
PH: 5 (ref 5.0–8.0)
Protein, ur: NEGATIVE mg/dL
SPECIFIC GRAVITY, URINE: 1.026 (ref 1.005–1.030)

## 2017-09-03 LAB — COMPREHENSIVE METABOLIC PANEL
ALBUMIN: 4.7 g/dL (ref 3.5–5.0)
ALT: 38 U/L (ref 14–54)
AST: 26 U/L (ref 15–41)
Alkaline Phosphatase: 65 U/L (ref 38–126)
Anion gap: 9 (ref 5–15)
BILIRUBIN TOTAL: 0.5 mg/dL (ref 0.3–1.2)
BUN: 19 mg/dL (ref 6–20)
CALCIUM: 8.7 mg/dL — AB (ref 8.9–10.3)
CO2: 19 mmol/L — AB (ref 22–32)
CREATININE: 0.68 mg/dL (ref 0.44–1.00)
Chloride: 107 mmol/L (ref 101–111)
GFR calc Af Amer: >60 mL/min (ref >60)
GFR calc non Af Amer: >60 mL/min (ref >60)
GLUCOSE: 113 mg/dL — AB (ref 65–99)
Potassium: 4 mmol/L (ref 3.5–5.1)
SODIUM: 135 mmol/L (ref 135–145)
TOTAL PROTEIN: 7.7 g/dL (ref 6.5–8.1)

## 2017-09-03 MED ORDER — CYCLOBENZAPRINE HCL 10 MG PO TABS
10.0000 mg | ORAL_TABLET | Freq: Once | ORAL | Status: AC
  Administered 2017-09-03: 10 mg via ORAL
  Filled 2017-09-03: qty 1

## 2017-09-03 MED ORDER — LIDOCAINE 5 % EX PTCH
1.0000 | MEDICATED_PATCH | Freq: Once | CUTANEOUS | Status: DC
  Administered 2017-09-03: 1 via TRANSDERMAL
  Filled 2017-09-03: qty 1

## 2017-09-03 MED ORDER — IBUPROFEN 600 MG PO TABS: 600.0000 mg | ORAL_TABLET | Freq: Three times a day (TID) | ORAL | 0 refills | Status: DC | PRN

## 2017-09-03 MED ORDER — HYDROCODONE-ACETAMINOPHEN 5-325 MG PO TABS: 1.0000 | ORAL_TABLET | Freq: Four times a day (QID) | ORAL | 0 refills | Status: DC | PRN

## 2017-09-03 MED ORDER — KETOROLAC TROMETHAMINE 30 MG/ML IJ SOLN
15.0000 mg | Freq: Once | INTRAMUSCULAR | Status: AC
  Administered 2017-09-03: 15 mg via INTRAVENOUS
  Filled 2017-09-03: qty 1

## 2017-09-03 NOTE — ED Notes (Signed)
FIRST NURSE NOTE:  Pt reports sharp pains in her chest. Pt deep breathing at front desk. States it was a sudden onset about 30 PTA.

## 2017-09-03 NOTE — ED Notes (Signed)
Patient to radiology.

## 2017-09-03 NOTE — ED Notes (Signed)
Pt to ed with c/o pain to left upper shoulder blade.  Pt tender to touch.

## 2017-09-03 NOTE — Discharge Instructions (Signed)
Please take your pain medication as needed for severe symptoms and follow-up with primary care within the next week with for reevaluation.  Return to the emergency department sooner for any new or worsening symptoms such as fevers, chills, worsening pain, or for any other issues whatsoever.  It was a pleasure to take care of you today, and thank you for coming to our emergency department.  If you have any questions or concerns before leaving please ask the nurse to grab me and I'm more than happy to go through your aftercare instructions again.  If you were prescribed any opioid pain medication today such as Norco, Vicodin, Percocet, morphine, hydrocodone, or oxycodone please make sure you do not drive when you are taking this medication as it can alter your ability to drive safely.  If you have any concerns once you are home that you are not improving or are in fact getting worse before you can make it to your follow-up appointment, please do not hesitate to call [phone] and come back for further evaluation.  Merrily Brittle, MD  Results for orders placed or performed during the hospital encounter of 09/03/17  Lipase, blood  Result Value Ref Range   Lipase 48 11 - 51 U/L  Comprehensive metabolic panel  Result Value Ref Range   Sodium 135 135 - 145 mmol/L   Potassium 4.0 3.5 - 5.1 mmol/L   Chloride 107 101 - 111 mmol/L   CO2 19 (L) 22 - 32 mmol/L   Glucose, Bld 113 (H) 65 - 99 mg/dL   BUN 19 6 - 20 mg/dL   Creatinine, Ser 1.61 0.44 - 1.00 mg/dL   Calcium 8.7 (L) 8.9 - 10.3 mg/dL   Total Protein 7.7 6.5 - 8.1 g/dL   Albumin 4.7 3.5 - 5.0 g/dL   AST 26 15 - 41 U/L   ALT 38 14 - 54 U/L   Alkaline Phosphatase 65 38 - 126 U/L   Total Bilirubin 0.5 0.3 - 1.2 mg/dL   GFR calc non Af Amer >60 >60 mL/min   GFR calc Af Amer >60 >60 mL/min   Anion gap 9 5 - 15  CBC  Result Value Ref Range   WBC 7.8 3.6 - 11.0 K/uL   RBC 4.87 3.80 - 5.20 MIL/uL   Hemoglobin 15.1 12.0 - 16.0 g/dL   HCT 09.6 04.5  - 40.9 %   MCV 90.7 80.0 - 100.0 fL   MCH 31.1 26.0 - 34.0 pg   MCHC 34.3 32.0 - 36.0 g/dL   RDW 81.1 91.4 - 78.2 %   Platelets 197 150 - 440 K/uL  Urinalysis, Complete w Microscopic  Result Value Ref Range   Color, Urine YELLOW (A) YELLOW   APPearance HAZY (A) CLEAR   Specific Gravity, Urine 1.026 1.005 - 1.030   pH 5.0 5.0 - 8.0   Glucose, UA NEGATIVE NEGATIVE mg/dL   Hgb urine dipstick SMALL (A) NEGATIVE   Bilirubin Urine NEGATIVE NEGATIVE   Ketones, ur 5 (A) NEGATIVE mg/dL   Protein, ur NEGATIVE NEGATIVE mg/dL   Nitrite POSITIVE (A) NEGATIVE   Leukocytes, UA NEGATIVE NEGATIVE   RBC / HPF 0-5 0 - 5 RBC/hpf   WBC, UA 0-5 0 - 5 WBC/hpf   Bacteria, UA FEW (A) NONE SEEN   Squamous Epithelial / LPF 0-5 (A) NONE SEEN   Mucus PRESENT   Pregnancy, urine POC  Result Value Ref Range   Preg Test, Ur NEGATIVE NEGATIVE   Dg Chest 2 View  Result Date: 09/03/2017 CLINICAL DATA:  Chest pain and shortness of breath. EXAM: CHEST  2 VIEW COMPARISON:  Chest x-ray dated 06/12/2016 FINDINGS: The heart size and mediastinal contours are within normal limits. Both lungs are clear except for minimal scarring at the left lung base, stable. No effusions. Minimal thoracolumbar scoliosis, stable. IMPRESSION: No active cardiopulmonary disease. Electronically Signed   By: Francene BoyersJames  Maxwell M.D.   On: 09/03/2017 11:25

## 2017-09-03 NOTE — ED Triage Notes (Signed)
Pt from home with sudden onset of left flank/ rib pain , no injury recalled , increased pain on movement , coughing spells, deep breathing.

## 2017-09-03 NOTE — ED Provider Notes (Signed)
[facility] Emergency Department Provider Note  ____________________________________________   First MD Initiated Contact with Patient 09/03/17 1122     (approximate)  I have reviewed the triage vital signs and the nursing notes.   HISTORY  Chief Complaint Flank Pain   HPI Brooke Anthony is a 37 y.o. female who self presents to the emergency department with sudden onset severe left flank left chest pain that began this morning when she got out of the shower and twisted and felt a "pop.  The pain is been constant ever since and is clearly worse when taking a deep breath.  Somewhat improved with rest.  Mild shortness of breath.  No fevers or chills.  Pain is nonexertional.  She did not actually fall.  She has no recent illness.  Past Medical History:  Diagnosis Date  . Medical history non-contributory     Patient Active Problem List   Diagnosis Date Noted  . Community acquired pneumonia 06/13/2016  . Postpartum care following vaginal delivery 08/06/2015  . Labor and delivery, indication for care 08/04/2015  . Echogenic intracardiac focus of fetus on prenatal ultrasound 05/06/2015    Past Surgical History:  Procedure Laterality Date  . NO PAST SURGERIES      Prior to Admission medications   Medication Sig Start Date End Date Taking? Authorizing Provider  HYDROcodone-acetaminophen (NORCO) 5-325 MG tablet Take 1 tablet by mouth every 6 (six) hours as needed for up to 7 doses for severe pain. 09/03/17   Merrily Brittleifenbark, Emmie Frakes, MD  ibuprofen (ADVIL,MOTRIN) 600 MG tablet Take 1 tablet (600 mg total) by mouth every 8 (eight) hours as needed. 09/03/17   Merrily Brittleifenbark, Giovanni Biby, MD  levofloxacin (LEVAQUIN) 750 MG tablet Take 1 tablet (750 mg total) by mouth daily. 06/13/16   Adrian SaranMody, Sital, MD  Multiple Vitamin (MULTIVITAMIN) capsule Take 1 capsule by mouth daily.    [provider]  Prenatal Vit-Fe Fumarate-FA (PRENATAL MULTIVITAMIN) TABS tablet Take 1 tablet by mouth  daily at 12 noon.    [provider]  traMADol (ULTRAM) 50 MG tablet Take 1 tablet (50 mg total) by mouth every 6 (six) hours as needed for moderate pain. 06/13/16   Adrian SaranMody, Sital, MD    Allergies Patient has no known allergies.  No family history on file.  Social History Social History   Tobacco Use  . Smoking status: Current Every Day Smoker    Packs/day: 0.50    Types: Cigarettes  . Smokeless tobacco: Never Used  Substance Use Topics  . Alcohol use: No  . Drug use: No    Review of Systems Constitutional: No fever/chills Eyes: No visual changes. ENT: No sore throat. Cardiovascular: Positive for chest pain. Respiratory: Positive for shortness of breath. Gastrointestinal: No abdominal pain.  No nausea, no vomiting.  No diarrhea.  No constipation. Genitourinary: Negative for dysuria. Musculoskeletal: Negative for back pain. Skin: Negative for rash. Neurological: Negative for headaches, focal weakness or numbness.   ____________________________________________   PHYSICAL EXAM:  VITAL SIGNS: ED Triage Vitals  Enc Vitals Group     BP 09/03/17 1031 107/75     Pulse Rate 09/03/17 1031 96     Resp 09/03/17 1031 18     Temp 09/03/17 1031 99.1 F (37.3 C)     Temp Source 09/03/17 1031 Oral     SpO2 09/03/17 1031 96 %     Weight 09/03/17 1031 140 lb (63.5 kg)     Height 09/03/17 1031 5\' 1"  (1.549 m)  Head Circumference --      Peak Flow --      Pain Score 09/03/17 1039 4     Pain Loc --      Pain Edu? --      Excl. in GC? --     Constitutional: Alert and oriented x4 sitting very still appears uncomfortable nontoxic no diaphoresis speaks in full clear sentences Eyes: PERRL EOMI. Head: Atraumatic. Nose: No congestion/rhinnorhea. Mouth/Throat: No trismus Neck: No stridor.   Cardiovascular: Normal rate, regular rhythm. Grossly normal heart sounds.  Good peripheral circulation. Respiratory: Normal respiratory effort.  No retractions. Lungs CTAB and moving  good air exquisitely tender to left lateral chest with no crepitus Gastrointestinal: Soft nontender Musculoskeletal: No lower extremity edema   Neurologic:  Normal speech and language. No gross focal neurologic deficits are appreciated. Skin:  Skin is warm, dry and intact. No rash noted. Psychiatric: Mood and affect are normal. Speech and behavior are normal.    ____________________________________________   DIFFERENTIAL includes but not limited to  Rib fracture, rib contusion, pneumothorax, pneumonia, pulmonary embolism ____________________________________________   LABS (all labs ordered are listed, but only abnormal results are displayed)  Labs Reviewed  COMPREHENSIVE METABOLIC PANEL - Abnormal; Notable for the following components:      Result Value   CO2 19 (*)    Glucose, Bld 113 (*)    Calcium 8.7 (*)    All other components within normal limits  URINALYSIS, COMPLETE (UACMP) WITH MICROSCOPIC - Abnormal; Notable for the following components:   Color, Urine YELLOW (*)    APPearance HAZY (*)    Hgb urine dipstick SMALL (*)    Ketones, ur 5 (*)    Nitrite POSITIVE (*)    Bacteria, UA FEW (*)    Squamous Epithelial / LPF 0-5 (*)    All other components within normal limits  LIPASE, BLOOD  CBC  POCT PREGNANCY, URINE    Lab work reviewed by me with low CO2 consistent with hyperventilation __________________________________________  EKG  ED ECG REPORT I, Merrily Brittle, the attending physician, personally viewed and interpreted this ECG.  Date: 09/03/2017 EKG Time:  Rate: 81 Rhythm: normal sinus rhythm QRS Axis: normal Intervals: normal ST/T Wave abnormalities: normal Narrative Interpretation: no evidence of acute ischemia  ____________________________________________  RADIOLOGY  Chest x-ray reviewed by me with no acute disease ____________________________________________   PROCEDURES  Procedure(s) performed: no  Procedures  Critical Care  performed: no  Observation: no ____________________________________________   INITIAL IMPRESSION / ASSESSMENT AND PLAN / ED COURSE  Pertinent labs & imaging results that were available during my care of the patient were reviewed by me and considered in my medical decision making (see chart for details).  The patient arrives very uncomfortable appearing with pleuritic chest pain after suddenly moving and hearing a "pop" chest x-ray with no evidence of pneumothorax or fracture.  I will treat her symptomatically and reevaluate.     ----------------------------------------- 12:23 PM on 09/03/2017 -----------------------------------------  The patient's pain is improved.  We will discharge her home with a short course of pain medication.  I do appreciate that she is nitrite positive however she reports no urinary symptoms so we will defer antibiotics at this time. ____________________________________________   FINAL CLINICAL IMPRESSION(S) / ED DIAGNOSES  Final diagnoses:  Chest wall pain      NEW MEDICATIONS STARTED DURING THIS VISIT:  Discharge Medication List as of 09/03/2017 12:23 PM    START taking these medications   Details  HYDROcodone-acetaminophen (NORCO)  5-325 MG tablet Take 1 tablet by mouth every 6 (six) hours as needed for up to 7 doses for severe pain., Starting Mon 09/03/2017, Print    ibuprofen (ADVIL,MOTRIN) 600 MG tablet Take 1 tablet (600 mg total) by mouth every 8 (eight) hours as needed., Starting Mon 09/03/2017, Print         Note:  This document was prepared using Dragon voice recognition software and may include unintentional dictation errors.     Merrily Brittle, MD 09/05/17 1249

## 2018-07-31 NOTE — L&D Delivery Note (Signed)
Delivery Note Primary OB: [facility] Delivery Provider: Avel Sensor, CNM Gestational Age: Full term Antepartum complications: post-term, GBS positive Intrapartum complications: None  A viable Female was delivered at 0017 via vertex presentation, ROA. No nuchal cord was present. Delivery of the shoulders and body followed without difficulty. The infant was placed on the maternal abdomen. Cord blood was collected. The umbilical cord was doubly clamped and cut following delayed cord clamping. The placenta was delivered spontaneously and was inspected and found to be intact with a three vessel cord. The cervix and vagina were inspected. A second degree perineal laceration was repaired with 2-0 Vicryl. The fundus was firm. Patient and infant were bonding in stable condition. All counts were correct.  Apgars: 8, 9  Weight: 7 lb, 9 oz    Anesthesia:  none Episiotomy:  none Lacerations:  2nd Suture Repair: 2.0 vicryl Quantitative. Blood Loss (mL): 583  Mom to postpartum.  Baby to Couplet care / Skin to Skin.  Avel Sensor, CNM [facility] 05/13/2019  1:19 AM

## 2019-02-10 ENCOUNTER — Other Ambulatory Visit: Payer: Self-pay | Admitting: Family Medicine

## 2019-02-10 ENCOUNTER — Other Ambulatory Visit: Payer: Self-pay

## 2019-02-10 ENCOUNTER — Ambulatory Visit: Payer: Self-pay | Admitting: Nurse Practitioner

## 2019-02-10 ENCOUNTER — Encounter: Payer: Self-pay | Admitting: Nurse Practitioner

## 2019-02-10 VITALS — BP 104/61 | Temp 98.2°F | Wt 154.2 lb

## 2019-02-10 DIAGNOSIS — O0990 Supervision of high risk pregnancy, unspecified, unspecified trimester: Secondary | ICD-10-CM

## 2019-02-10 DIAGNOSIS — B3731 Acute candidiasis of vulva and vagina: Secondary | ICD-10-CM

## 2019-02-10 DIAGNOSIS — B373 Candidiasis of vulva and vagina: Secondary | ICD-10-CM

## 2019-02-10 LAB — WET PREP FOR TRICH, YEAST, CLUE: Trichomonas Exam: NEGATIVE

## 2019-02-10 LAB — OB RESULTS CONSOLE RUBELLA ANTIBODY, IGM: Rubella: IMMUNE

## 2019-02-10 LAB — HIV ANTIBODY (ROUTINE TESTING W REFLEX): HIV 1&2 Ab, 4th Generation: NONREACTIVE

## 2019-02-10 LAB — HEMOGLOBIN, FINGERSTICK: Hemoglobin: 11.9 g/dL (ref 11.1–15.9)

## 2019-02-10 LAB — URINALYSIS
Bilirubin, UA: NEGATIVE
Glucose, UA: NEGATIVE
Ketones, UA: NEGATIVE
Nitrite, UA: NEGATIVE
Protein,UA: NEGATIVE
RBC, UA: NEGATIVE
Specific Gravity, UA: 1.03 (ref 1.005–1.030)
Urobilinogen, Ur: 0.2 mg/dL (ref 0.2–1.0)
pH, UA: 6.5 (ref 5.0–7.5)

## 2019-02-10 LAB — OB RESULTS CONSOLE VARICELLA ZOSTER ANTIBODY, IGG: Varicella: IMMUNE

## 2019-02-10 MED ORDER — CLOTRIMAZOLE 1 % VA CREA: 1.0000 | TOPICAL_CREAM | Freq: Every day | VAGINAL | 0 refills | Status: AC

## 2019-02-10 NOTE — Progress Notes (Signed)
Desires BTL after delivery Does not want to know sex of baby with [place] Pregnancy was unplanned Client seems irritated Referral for [place] completed Discussed smoking cessation - client declines information Moderate amt white DC - >4.5 ph Pap completed Undecided on breastfeeding   Subjective:  Brooke Anthony is a 38 y.o. female being seen today for initial prenatal visit. The patient reports they do not have symptoms.  Patient has the following medical conditionshas Supervision of high risk pregnancy, antepartum on their problem list.  Chief Complaint  Patient presents with  . Initial Prenatal Visit    Patient reports  HPI   See flowsheet for further details and programmatic requirements.    The following portions of the patient's history were reviewed and updated as appropriate: allergies, current medications, past family history, past medical history, past social history, past surgical history and problem list. Problem list updated.  Objective:   Vitals:   02/10/19 1325  BP: 104/61  Temp: 98.2 F (36.8 C)  Weight: 154 lb 3.2 oz (69.9 kg)    Physical Exam Chest:     Comments: Discussed monthly SBE's Genitourinary:    Comments: Ph > 4.5 Moderate amt white dc noted Pap with cotesting completed - await results      Assessment and Plan:  Brooke Anthony is a 38 y.o. female presenting to the [facility] Department for STI screening  There are no diagnoses linked to this encounter.    Return in about 2 weeks (around 02/24/2019) for routine prenatal care.  No future appointments.  Berniece Andreas, NP

## 2019-02-10 NOTE — Patient Instructions (Signed)

## 2019-02-10 NOTE — Progress Notes (Signed)
[facility] [address] [place] [address] [address] [place] [id number] [phone]  INITIAL PRENATAL VISIT NOTE  Subjective:  Brooke HissKaybelene Mccollister is a 38 y.o. B2W4132G5P3013 at 5075w4d being seen today to start [facility].  She is currently monitored for the following issues for this high-risk pregnancy and has Supervision of high risk pregnancy, antepartum on their problem list.  Patient reports no complaints.  Contractions: Not present. Vag. Bleeding: None, Moderate.  Movement: Present. Denies leaking of fluid.   The following portions of the patient's history were reviewed and updated as appropriate: allergies, current medications, past family history, past medical history, past social history, past surgical history and problem list. Problem list updated.  Objective:   Vitals:   02/10/19 1325  BP: 104/61  Temp: 98.2 F (36.8 C)  Weight: 154 lb 3.2 oz (69.9 kg)    Fetal Status: Fetal Heart Rate (bpm): 150 Fundal Height: 23 cm Movement: Present     Physical Exam Vitals signs and nursing note reviewed.  Constitutional:      Appearance: Normal appearance.  HENT:     Head: Normocephalic.     Right Ear: Hearing normal.     Left Ear: Hearing normal.     Nose: Nose normal.     Mouth/Throat:     Lips: Pink.     Mouth: Mucous membranes are moist.  Eyes:     General: Lids are normal.     Extraocular Movements: Extraocular movements intact.  Neck:     Musculoskeletal: Full passive range of motion without pain, normal range of motion and neck supple.  Cardiovascular:     Rate and Rhythm: Normal rate and regular rhythm.     Pulses: Normal pulses.     Heart sounds: Normal heart sounds.  Pulmonary:     Effort: Pulmonary effort is normal.     Breath sounds: Normal breath sounds.     Comments: CBE completed - discussed monthly SBE's Client plans to breastfeed/formula feed. Chest:   Breasts:        Right: Normal.        Left: Normal.  Abdominal:     Palpations: Abdomen is soft.     Comments: Client pregnant   Genitourinary:    Vagina: Normal.     Cervix: Normal.     Uterus: Enlarged.      Rectum: Normal.     Comments: Due to ~ 20+ wks gestation Musculoskeletal: Normal range of motion.     Right lower leg: No edema.     Left lower leg: No edema.  Lymphadenopathy:     Cervical: No cervical adenopathy.  Skin:    General: Skin is warm and dry.  Neurological:     Mental Status: She is alert and oriented to person, place, and time.  Psychiatric:        Attention and Perception: Attention normal.        Mood and Affect: Mood normal.        Speech: Speech normal.        Behavior: Behavior normal. Behavior is cooperative.        Cognition and Memory: Cognition and memory normal.     Assessment and Plan:  Pregnancy: G5P3013 at 7375w4d  1. Supervision of high risk pregnancy, antepartum Client into clinic for initial IP visit Denies any complaints or questions at this time  Also desires anatomy US with genetics testing during gestational time  recommended No additional action required at this time. Pap with cotesting completed - await results Reviewed OB flow sheet questions Discussed monthly SBE's - CBE completed today   Await results for the following tests: - Hemoglobin, venipuncture - Urinalysis (Urine Dip) - WET PREP FOR TRICH, YEAST, CLUE - Urine Culture - Prenatal profile with Varicella - IGP, Aptima HPV - [id number] Drug Screen - QuantiFERON-TB Gold Plus - Chlamydia/GC NAA, Confirmation - HIV [place] - Drug Screen, Urine  2. Vaginal yeast infection - clotrimazole (GYNE-LOTRIMIN) 1 % vaginal cream; Place 1 Applicatorful vaginally at bedtime for 7 days.  Dispense: 45 g; Refill: 0    Discussed overview of care and coordination with inpatient delivery practices including [facility], [facility], [facility] and [facility].   Reviewed Centering  pregnancy as standard of care at [facility], oriented to room and showed video. Based on EDD, plan for Cycle - discussed with client Zoom Centering during pregnancy.      Preterm labor symptoms and general obstetric precautions including but not limited to vaginal bleeding, contractions, leaking of fluid and fetal movement were reviewed in detail with the patient.  Please refer to After Visit Summary for other counseling recommendations.   Return in about 2 weeks (around 02/24/2019) for routine prenatal care.  Future Appointments  Date Time Provider [place]  02/18/2019  1:00 PM [facility]  02/24/2019 10:20 AM [facility] PROVIDER [facility] None    Berniece Andreas, NP

## 2019-02-10 NOTE — Progress Notes (Signed)
Here for new Ob appt. Denies s/s or exposure to Covid-19. Taking PNV QD, denies travel for self/partner, 01/24/19 ultrasound report (in chart.) Hal Morales, RN

## 2019-02-11 ENCOUNTER — Other Ambulatory Visit: Payer: Self-pay | Admitting: Nurse Practitioner

## 2019-02-11 DIAGNOSIS — Z3482 Encounter for supervision of other normal pregnancy, second trimester: Secondary | ICD-10-CM

## 2019-02-12 LAB — CBC/D/PLT+RPR+RH+ABO+AB SCR
Antibody Screen: NEGATIVE
Basophils Absolute: 0 10*3/uL (ref 0.0–0.2)
Basos: 0 %
EOS (ABSOLUTE): 0.1 10*3/uL (ref 0.0–0.4)
Eos: 1 %
Hematocrit: 36.4 % (ref 34.0–46.6)
Hemoglobin: 11.9 g/dL (ref 11.1–15.9)
Hepatitis B Surface Ag: NEGATIVE
Lymphocytes Absolute: 2 10*3/uL (ref 0.7–3.1)
Lymphs: 14 %
MCH: 30.5 pg (ref 26.6–33.0)
MCHC: 32.7 g/dL (ref 31.5–35.7)
MCV: 93 fL (ref 79–97)
Monocytes Absolute: 1.4 10*3/uL — ABNORMAL HIGH (ref 0.1–0.9)
Monocytes: 10 %
Neutrophils Absolute: 9.8 10*3/uL — ABNORMAL HIGH (ref 1.4–7.0)
Neutrophils: 69 %
Platelets: 207 10*3/uL (ref 150–450)
RBC: 3.9 x10E6/uL (ref 3.77–5.28)
RDW: 12.7 % (ref 11.7–15.4)
RPR Ser Ql: NONREACTIVE
Rh Factor: POSITIVE
WBC: 14 10*3/uL — ABNORMAL HIGH (ref 3.4–10.8)

## 2019-02-12 LAB — IMMATURE CELLS
Bands(Auto) Relative: 1 %
MYELOCYTES: 1 % — ABNORMAL HIGH (ref 0–0)
Metamyelocytes: 4 % — ABNORMAL HIGH (ref 0–0)

## 2019-02-12 LAB — QUANTIFERON-TB GOLD PLUS
QuantiFERON Mitogen Value: 0.42 IU/mL
QuantiFERON Nil Value: 0.02 IU/mL
QuantiFERON TB1 Ag Value: 0.03 IU/mL
QuantiFERON TB2 Ag Value: 0.02 IU/mL
QuantiFERON-TB Gold Plus: UNDETERMINED — AB

## 2019-02-14 LAB — CHLAMYDIA/GC NAA, CONFIRMATION
Chlamydia trachomatis, NAA: NEGATIVE
Neisseria gonorrhoeae, NAA: NEGATIVE

## 2019-02-14 LAB — 789231 7+OXYCODONE-BUND
Amphetamines, Urine: NEGATIVE ng/mL
BENZODIAZ UR QL: NEGATIVE ng/mL
Barbiturate screen, urine: NEGATIVE ng/mL
Cannabinoid Quant, Ur: NEGATIVE ng/mL
Cocaine (Metab.): NEGATIVE ng/mL
OPIATE SCREEN URINE: NEGATIVE ng/mL
Oxycodone/Oxymorphone, Urine: NEGATIVE ng/mL
PCP Quant, Ur: NEGATIVE ng/mL

## 2019-02-14 LAB — URINE CULTURE

## 2019-02-18 ENCOUNTER — Ambulatory Visit
Admission: RE | Admit: 2019-02-18 | Discharge: 2019-02-18 | Disposition: A | Discharge status: Home / Self Care | Payer: Self-pay | Source: Ambulatory Visit | Attending: Nurse Practitioner | Admitting: Nurse Practitioner

## 2019-02-18 ENCOUNTER — Other Ambulatory Visit: Payer: Self-pay

## 2019-02-18 DIAGNOSIS — Z3482 Encounter for supervision of other normal pregnancy, second trimester: Secondary | ICD-10-CM | POA: Insufficient documentation

## 2019-02-19 LAB — IGP, APTIMA HPV
HPV Aptima: NEGATIVE
PAP Smear Comment: 0

## 2019-02-24 ENCOUNTER — Ambulatory Visit: Payer: Self-pay

## 2019-02-27 ENCOUNTER — Ambulatory Visit: Payer: Self-pay | Admitting: Physician Assistant

## 2019-02-27 ENCOUNTER — Other Ambulatory Visit: Payer: Self-pay

## 2019-02-27 ENCOUNTER — Telehealth: Payer: Self-pay

## 2019-02-27 VITALS — BP 98/64 | Temp 98.0°F | Wt 156.8 lb

## 2019-02-27 DIAGNOSIS — Z3480 Encounter for supervision of other normal pregnancy, unspecified trimester: Secondary | ICD-10-CM

## 2019-02-27 DIAGNOSIS — Z720 Tobacco use: Secondary | ICD-10-CM | POA: Insufficient documentation

## 2019-02-27 DIAGNOSIS — O0990 Supervision of high risk pregnancy, unspecified, unspecified trimester: Secondary | ICD-10-CM

## 2019-02-27 NOTE — Progress Notes (Signed)
Taking PNV QD, denes s/s or exposure to Covid-19. 1hgtt and Tdap given today.  Hal Morales, RN

## 2019-02-27 NOTE — Progress Notes (Signed)
   PRENATAL VISIT NOTE  Subjective:  Brooke Anthony is a 38 y.o. G9F6213 at [redacted]w[redacted]d being seen today for ongoing prenatal care.  She is currently monitored for the following issues for this high-risk pregnancy and has Supervision of high risk pregnancy, antepartum and Tobacco abuse on their problem list.  Patient reports no complaints.  Contractions: Not present. Vag. Bleeding: None.  Movement: Present. Denies leaking of fluid/ROM.   The following portions of the patient's history were reviewed and updated as appropriate: allergies, current medications, past family history, past medical history, past social history, past surgical history and problem list. Problem list updated.  Objective:   Vitals:   02/27/19 1527  BP: 98/64  Temp: 98 F (36.7 C)  Weight: 156 lb 12.8 oz (71.1 kg)    Fetal Status: Fetal Heart Rate (bpm): 144 Fundal Height: 31 cm Movement: Present     General:  Alert, oriented and cooperative. Patient is in no acute distress.  Skin: Skin is warm and dry. No rash noted.   Cardiovascular: Normal heart rate noted  Respiratory: Normal respiratory effort, no problems with respiration noted  Abdomen: Soft, gravid, appropriate for gestational age.  Pain/Pressure: Absent     Pelvic: Cervical exam deferred        Extremities: Normal range of motion.  Edema: None  Mental Status: Normal mood and affect. Normal behavior. Normal judgment and thought content.   Assessment and Plan:  Pregnancy: [id number] at [redacted]w[redacted]d  1. Supervision of other normal pregnancy, antepartum Feels well, no concerns. Discussed update of EDC per [place]. Pt states she had documented her LMP inaccurately in her menstrual app. Agrees to telehealth RV. - Glucose, 1 hour gestational - Tdap vaccine greater than or equal to 7yo IM  2. Supervision of high risk pregnancy, antepartum See above.  3. Tobacco abuse Praised tobacco reduction from 1ppd to 1/2 ppd with known pregnancy and counseled cessation via  5As..    Preterm labor symptoms and general obstetric precautions including but not limited to vaginal bleeding, contractions, leaking of fluid and fetal movement were reviewed in detail with the patient. Please refer to After Visit Summary for other counseling recommendations.  Return in about 2 weeks (around 03/13/2019) for Routine prenatal care, Telehealth.  No future appointments.  Lora Havens, PA-C

## 2019-02-27 NOTE — Telephone Encounter (Signed)
Patient left before scheduling next RV appt. Phone call to patient for above. No answer, [facility] requesting that patient call back to schedule 2 week Telehealth appt.  Hal Morales, RN

## 2019-02-28 ENCOUNTER — Telehealth: Payer: Self-pay

## 2019-02-28 DIAGNOSIS — O99810 Abnormal glucose complicating pregnancy: Secondary | ICD-10-CM

## 2019-02-28 HISTORY — DX: Abnormal glucose complicating pregnancy: O99.810

## 2019-02-28 LAB — GLUCOSE, 1 HOUR GESTATIONAL: Gestational Diabetes Screen: 156 mg/dL — ABNORMAL HIGH (ref 65–139)

## 2019-02-28 NOTE — Telephone Encounter (Signed)
Abnl 1 Hr=>needs 3 Hr Gtt & also needs 2 wk f/u appt.; no answer, left voicemail messsage Debera Lat, RN

## 2019-03-03 ENCOUNTER — Encounter: Payer: Self-pay | Admitting: Nurse Practitioner

## 2019-03-03 DIAGNOSIS — B951 Streptococcus, group B, as the cause of diseases classified elsewhere: Secondary | ICD-10-CM | POA: Insufficient documentation

## 2019-03-03 DIAGNOSIS — O98819 Other maternal infectious and parasitic diseases complicating pregnancy, unspecified trimester: Secondary | ICD-10-CM | POA: Insufficient documentation

## 2019-03-03 NOTE — Telephone Encounter (Signed)
Patient scheduled for 2 week telehealth appt. Also scheduled for 3 hr. Gtt.Marland KitchenMarland KitchenJenetta Downer, RN

## 2019-03-04 ENCOUNTER — Telehealth: Payer: Self-pay

## 2019-03-04 NOTE — Telephone Encounter (Signed)
Client notified of indeterminate Quantiferon TB Gold test result and test will be repeated at 03/06/2019 3 hour GTT appt. Note to obtain added to pink sticky note. Shona Needles, RN

## 2019-03-04 NOTE — Telephone Encounter (Signed)
Client counseled has UTI and needs treatment. Reviewed ordered medication with client and will not take am of 03/06/2019 as has 3 hour GTT and without food x12 hours prior to test. Client requested prescription be called in to her [facility] pharmacy. Per written order of Jerline Pain FNP-BC, the following called in to client's pharmacy of record: Amoxicillin 250 mg and to take one capsule TID x7 days. Dispense #21, no refills. Order provided directly to person, not automated phone line. Shona Needles, RN

## 2019-03-06 ENCOUNTER — Other Ambulatory Visit: Payer: Self-pay

## 2019-03-06 DIAGNOSIS — Z3480 Encounter for supervision of other normal pregnancy, unspecified trimester: Secondary | ICD-10-CM

## 2019-03-06 NOTE — Progress Notes (Signed)
Patient in nurse clinic today for 3 hour GTT. Instructions given, patient sent to lab. No history of travel noted. QFt also drawn d/t a indeterminate reading from last QFT. Aileen Fass, RN

## 2019-03-09 LAB — GLUCOSE TOLERANCE TEST, 6 HOUR
Glucose, 1 Hour GTT: 210 mg/dL — ABNORMAL HIGH (ref 65–199)
Glucose, 2 hour: 133 mg/dL (ref 65–139)
Glucose, 3 hour: 119 mg/dL — ABNORMAL HIGH (ref 65–109)
Glucose, GTT - Fasting: 77 mg/dL (ref 65–99)

## 2019-03-09 LAB — QUANTIFERON-TB GOLD PLUS
QuantiFERON Mitogen Value: 0.09 IU/mL
QuantiFERON Nil Value: 0.01 IU/mL
QuantiFERON TB1 Ag Value: 0.01 IU/mL
QuantiFERON TB2 Ag Value: 0.01 IU/mL
QuantiFERON-TB Gold Plus: UNDETERMINED — AB

## 2019-03-13 ENCOUNTER — Ambulatory Visit: Payer: Self-pay | Admitting: Nurse Practitioner

## 2019-03-13 ENCOUNTER — Encounter: Payer: Self-pay | Admitting: Nurse Practitioner

## 2019-03-13 ENCOUNTER — Other Ambulatory Visit: Payer: Self-pay

## 2019-03-13 DIAGNOSIS — O0990 Supervision of high risk pregnancy, unspecified, unspecified trimester: Secondary | ICD-10-CM

## 2019-03-13 DIAGNOSIS — B951 Streptococcus, group B, as the cause of diseases classified elsewhere: Secondary | ICD-10-CM

## 2019-03-13 DIAGNOSIS — O99810 Abnormal glucose complicating pregnancy: Secondary | ICD-10-CM

## 2019-03-13 NOTE — Progress Notes (Signed)
Call to client for Telehealth visit; confirmed pt identity; discussed limitations of visit and informed is a billable visit-agrees to visit; taking PNV; informed of Q. Gold "indeterminate" result again-to discuss plan with FNP Debera Lat, RN

## 2019-03-13 NOTE — Progress Notes (Signed)
   TELEPHONE OBSTETRICS VISIT ENCOUNTER NOTE  I connected with Brooke Anthony on 03/13/19 at  8:40 AM EDT by telephone at home and verified that I am speaking with the correct person using two identifiers.   I discussed the limitations, risks, security and privacy concerns of performing an evaluation and management service by telephone and the availability of in person appointments. I also discussed with the patient that there may be a patient responsible charge related to this service. The patient expressed understanding and agreed to proceed.  Subjective:  Brooke Anthony is a 38 y.o. A6T0160 at [redacted]w[redacted]d being followed for ongoing prenatal care.  She is currently monitored for the following issues for this high-risk pregnancy and has Supervision of high risk pregnancy, antepartum; Tobacco abuse; Abnormal glucose tolerance in pregnancy; and Group B streptococcal infection in pregnancy on their problem list.  Patient reports no complaints. Reports fetal movement. Denies any contractions, bleeding or leaking of fluid.   The following portions of the patient's history were reviewed and updated as appropriate: allergies, current medications, past family history, past medical history, past social history, past surgical history and problem list.   Objective:   General:  Alert, oriented and cooperative.   Mental Status: Normal mood and affect perceived. Normal judgment and thought content.  Rest of physical exam deferred due to type of encounter  Assessment and Plan:  Pregnancy: G5P3013 at [redacted]w[redacted]d  1. Abnormal glucose tolerance in pregnancy Equivocal 3 hr GTT on 03/06/2019 with F=77, 1 hr=210, 2 hr=133, 3 hr=119. Needs MNT referral, monitor fundal height closely, growth u/s -referral completed  2. Supervision of high risk pregnancy, antepartum Client doing well. OB flowsheet questions reviewed and neg  3.  Indeterminant QFT Gold x2 Consulted with Dr. Ernestina Patches, plan of care is to place  PPD at next appt in 2 wks  Preterm labor symptoms and general obstetric precautions including but not limited to vaginal bleeding, contractions, leaking of fluid and fetal movement were reviewed in detail with the patient.  I discussed the assessment and treatment plan with the patient. The patient was provided an opportunity to ask questions and all were answered. The patient agreed with the plan and demonstrated an understanding of the instructions. The patient was advised to call back or seek an in-person office evaluation/go to the hospital for any urgent or concerning symptoms.  Please refer to After Visit Summary for other counseling recommendations.   I provided 7 minutes of non-face-to-face time during this encounter.  Return in about 2 weeks (around 03/27/2019) for PPD placement, routine PNC.  Future Appointments  Date Time Provider [place]  03/27/2019  8:40 AM [facility] PROVIDER [facility] None    Herbie Saxon, CNM

## 2019-03-14 ENCOUNTER — Encounter: Payer: Self-pay | Admitting: Family Medicine

## 2019-03-14 DIAGNOSIS — O0990 Supervision of high risk pregnancy, unspecified, unspecified trimester: Secondary | ICD-10-CM

## 2019-03-14 DIAGNOSIS — O4440 Low lying placenta NOS or without hemorrhage, unspecified trimester: Secondary | ICD-10-CM

## 2019-03-14 DIAGNOSIS — B951 Streptococcus, group B, as the cause of diseases classified elsewhere: Secondary | ICD-10-CM

## 2019-03-14 DIAGNOSIS — O0930 Supervision of pregnancy with insufficient antenatal care, unspecified trimester: Secondary | ICD-10-CM | POA: Insufficient documentation

## 2019-03-26 ENCOUNTER — Other Ambulatory Visit: Payer: Self-pay | Admitting: Advanced Practice Midwife

## 2019-03-26 DIAGNOSIS — O99810 Abnormal glucose complicating pregnancy: Secondary | ICD-10-CM

## 2019-03-27 ENCOUNTER — Other Ambulatory Visit: Payer: Self-pay

## 2019-03-27 ENCOUNTER — Ambulatory Visit: Payer: Self-pay | Admitting: Physician Assistant

## 2019-03-27 ENCOUNTER — Ambulatory Visit: Payer: Self-pay

## 2019-03-27 DIAGNOSIS — O0990 Supervision of high risk pregnancy, unspecified, unspecified trimester: Secondary | ICD-10-CM

## 2019-03-27 DIAGNOSIS — O99810 Abnormal glucose complicating pregnancy: Secondary | ICD-10-CM

## 2019-03-27 DIAGNOSIS — O2340 Unspecified infection of urinary tract in pregnancy, unspecified trimester: Secondary | ICD-10-CM | POA: Insufficient documentation

## 2019-03-27 DIAGNOSIS — O2343 Unspecified infection of urinary tract in pregnancy, third trimester: Secondary | ICD-10-CM

## 2019-03-27 DIAGNOSIS — O4440 Low lying placenta NOS or without hemorrhage, unspecified trimester: Secondary | ICD-10-CM

## 2019-03-27 NOTE — Progress Notes (Signed)
In for visit; discussed error with EDC entry and EDC changed to 04/09/19; agrees to returning 03/28/19 for PPD due to indeterminate QF Gold x 2; TOC C & S today; discussed MNT referral for equivocal 3 Hr Gtt-declines referral, states "will eat healthier"; undecided on [facility]; self collection of cultures today Debera Lat, RN

## 2019-03-27 NOTE — Progress Notes (Signed)
   PRENATAL VISIT NOTE  Subjective:  Brooke Anthony is a 38 y.o. F6O1308 at [redacted]w[redacted]d being seen today for ongoing prenatal care.  She is currently monitored for the following issues for this high-risk pregnancy and has Supervision of high risk pregnancy, antepartum; Tobacco abuse; Abnormal glucose tolerance in pregnancy; Group B streptococcal infection in pregnancy; Late prenatal care affecting pregnancy, antepartum; Low-lying placenta; and UTI in pregnancy on their problem list.  Patient reports no complaints.   .  .   . Has occ bilat hand swelling, none at present. Denies leaking of fluid/ROM.   The following portions of the patient's history were reviewed and updated as appropriate: allergies, current medications, past family history, past medical history, past social history, past surgical history and problem list. Problem list updated.  Objective:   Vitals:   03/27/19 1456  BP: 115/62  Temp: 98.6 F (37 C)  Weight: 158 lb 9.6 oz (71.9 kg)    Fetal Status:           General:  Alert, oriented and cooperative. Patient is in no acute distress.  Skin: Skin is warm and dry. No rash noted.   Cardiovascular: Normal heart rate noted  Respiratory: Normal respiratory effort, no problems with respiration noted  Abdomen: Soft, gravid, appropriate for gestational age.        Pelvic: Cervical exam deferred        Extremities: Normal range of motion.     Mental Status: Normal mood and affect. Normal behavior. Normal judgment and thought content.   Assessment and Plan:  Pregnancy: M5H8469 at [redacted]w[redacted]d  1. Low-lying placenta Schedule [place] at [facility] as soon as possible.  2. Abnormal glucose tolerance in pregnancy Enc pt to keep MNT/nutrition visit - pt declines  3. Supervision of high risk pregnancy, antepartum Reiterated EDC. - Chlamydia/GC NAA, Confirmation  4. Urinary tract infection in mother during third trimester of pregnancy U C&S for TOC of GBS UTI today. - Urine  Culture   Term labor symptoms and general obstetric precautions including but not limited to vaginal bleeding, contractions, leaking of fluid and fetal movement were reviewed in detail with the patient. Please refer to After Visit Summary for other counseling recommendations.  Return in about 1 week (around 04/03/2019) for Routine prenatal care.  No future appointments.  Lora Havens, PA-C

## 2019-03-28 ENCOUNTER — Encounter: Payer: Self-pay | Admitting: Advanced Practice Midwife

## 2019-03-28 ENCOUNTER — Telehealth: Payer: Self-pay | Admitting: Advanced Practice Midwife

## 2019-03-28 ENCOUNTER — Other Ambulatory Visit: Payer: Self-pay

## 2019-03-28 DIAGNOSIS — Z111 Encounter for screening for respiratory tuberculosis: Secondary | ICD-10-CM

## 2019-03-28 NOTE — Telephone Encounter (Signed)
T/c to pt on 03/28/19 to review dating discrepancy.  First u/s done 02/18/19 at 29.0 wks with EDC of 05/06/19. So pt is 34 3/7 wks today.  Informed pt of f/u u/s for low lying placenta on 04/03/19 2:15 and RV appt here same day at 3:45.  Pt states needs Tb skin test--to come in today at 1:45 to be seen in nurse clinic.  Pt agreeable to plan

## 2019-03-29 LAB — URINE CULTURE

## 2019-03-31 ENCOUNTER — Other Ambulatory Visit: Payer: Self-pay

## 2019-03-31 ENCOUNTER — Ambulatory Visit (LOCAL_COMMUNITY_HEALTH_CENTER): Payer: Self-pay

## 2019-03-31 DIAGNOSIS — Z111 Encounter for screening for respiratory tuberculosis: Secondary | ICD-10-CM

## 2019-03-31 LAB — TB SKIN TEST
Induration: 0 mm
TB Skin Test: NEGATIVE

## 2019-03-31 LAB — CHLAMYDIA/GC NAA, CONFIRMATION
Chlamydia trachomatis, NAA: NEGATIVE
Neisseria gonorrhoeae, NAA: NEGATIVE

## 2019-04-02 ENCOUNTER — Other Ambulatory Visit: Payer: Self-pay | Admitting: Advanced Practice Midwife

## 2019-04-02 DIAGNOSIS — O99810 Abnormal glucose complicating pregnancy: Secondary | ICD-10-CM

## 2019-04-02 DIAGNOSIS — O4440 Low lying placenta NOS or without hemorrhage, unspecified trimester: Secondary | ICD-10-CM

## 2019-04-02 DIAGNOSIS — Z3689 Encounter for other specified antenatal screening: Secondary | ICD-10-CM

## 2019-04-03 ENCOUNTER — Other Ambulatory Visit: Payer: Self-pay

## 2019-04-03 ENCOUNTER — Encounter: Payer: Self-pay | Admitting: Physician Assistant

## 2019-04-03 ENCOUNTER — Ambulatory Visit: Payer: Self-pay | Admitting: Physician Assistant

## 2019-04-03 ENCOUNTER — Ambulatory Visit: Admission: RE | Admit: 2019-04-03 | Payer: Self-pay | Source: Ambulatory Visit

## 2019-04-03 VITALS — BP 113/64 | Temp 97.3°F | Wt 161.6 lb

## 2019-04-03 DIAGNOSIS — O4440 Low lying placenta NOS or without hemorrhage, unspecified trimester: Secondary | ICD-10-CM

## 2019-04-03 DIAGNOSIS — O0990 Supervision of high risk pregnancy, unspecified, unspecified trimester: Secondary | ICD-10-CM

## 2019-04-03 DIAGNOSIS — Z720 Tobacco use: Secondary | ICD-10-CM

## 2019-04-03 NOTE — Progress Notes (Signed)
Patient here for maternity visit at 64 2/[redacted] weeks gestation. Patient missed U/S today and plans to reschedule.Jenetta Downer, RN

## 2019-04-03 NOTE — Progress Notes (Signed)
   PRENATAL VISIT NOTE  Subjective:  Brooke Anthony is a 38 y.o. B6L8937 at [redacted]w[redacted]d being seen today for ongoing prenatal care.  She is currently monitored for the following issues for this high-risk pregnancy and has Supervision of high risk pregnancy, antepartum; Tobacco abuse; Abnormal glucose tolerance in pregnancy; Group B streptococcal infection in pregnancy; Late prenatal care affecting pregnancy, antepartum; Low-lying placenta; and UTI in pregnancy on their problem list.  Patient reports no complaints.  Contractions: Not present. Vag. Bleeding: None.  Movement: Present. Denies leaking of fluid/ROM.   The following portions of the patient's history were reviewed and updated as appropriate: allergies, current medications, past family history, past medical history, past social history, past surgical history and problem list. Problem list updated.  Objective:   Vitals:   04/03/19 1618  BP: 113/64  Temp: (!) 97.3 F (36.3 C)  Weight: 161 lb 9.6 oz (73.3 kg)    Fetal Status: Fetal Heart Rate (bpm): 136 Fundal Height: 37 cm Movement: Present  Presentation: Vertex  General:  Alert, oriented and cooperative. Patient is in no acute distress.  Skin: Skin is warm and dry. No rash noted.   Cardiovascular: Normal heart rate noted  Respiratory: Normal respiratory effort, no problems with respiration noted  Abdomen: Soft, gravid, appropriate for gestational age.  Pain/Pressure: Absent     Pelvic: Cervical exam deferred        Extremities: Normal range of motion.  Edema: Trace  Mental Status: Normal mood and affect. Normal behavior. Normal judgment and thought content.   Assessment and Plan:  Pregnancy: [id number] at [redacted]w[redacted]d  1. Low-lying placenta Pt missed [place] appt today. Strongly encouraged to reschedule ASAP. Counseled re: risks of hemorrhage of mother and of morbidity and mortality to mother and baby in the prenatal and peripartum period. Pt to report any bleeding and seek immediate  care.  2. Supervision of high risk pregnancy, antepartum Enc to push fluids, prepare for baby. Avoid caffeine within 12 hours of sleep.  3. Tobacco abuse Has cut from 20 to 10 cpd during pregnancy, plans to quit completely while in hosp for delivery.    Preterm labor symptoms and general obstetric precautions including but not limited to vaginal bleeding, contractions, leaking of fluid and fetal movement were reviewed in detail with the patient. Please refer to After Visit Summary for other counseling recommendations.  Return in about 1 week (around 04/10/2019) for 36 wk labs.  No future appointments.  Lora Havens, PA-C

## 2019-04-10 ENCOUNTER — Ambulatory Visit: Payer: Self-pay | Admitting: Physician Assistant

## 2019-04-10 ENCOUNTER — Other Ambulatory Visit: Payer: Self-pay

## 2019-04-10 ENCOUNTER — Encounter: Payer: Self-pay | Admitting: Physician Assistant

## 2019-04-10 DIAGNOSIS — O4440 Low lying placenta NOS or without hemorrhage, unspecified trimester: Secondary | ICD-10-CM

## 2019-04-10 DIAGNOSIS — O0990 Supervision of high risk pregnancy, unspecified, unspecified trimester: Secondary | ICD-10-CM

## 2019-04-10 DIAGNOSIS — O09529 Supervision of elderly multigravida, unspecified trimester: Secondary | ICD-10-CM | POA: Insufficient documentation

## 2019-04-10 NOTE — Progress Notes (Signed)
   PRENATAL VISIT NOTE  Subjective:  Brooke Anthony is a 38 y.o. I2L7989 at [redacted]w[redacted]d being seen today for ongoing prenatal care.  She is currently monitored for the following issues for this high-risk pregnancy and has Supervision of high risk pregnancy, antepartum; Tobacco abuse; Abnormal glucose tolerance in pregnancy; Group B streptococcal infection in pregnancy; Late prenatal care affecting pregnancy, antepartum; Low-lying placenta; UTI in pregnancy; and Elderly multigravida, currently pregnant on their problem list.  Patient reports no complaints.  Contractions: Not present. Vag. Bleeding: None.  Movement: Present. Denies leaking of fluid/ROM.   The following portions of the patient's history were reviewed and updated as appropriate: allergies, current medications, past family history, past medical history, past social history, past surgical history and problem list. Problem list updated.  Objective:   Vitals:   04/10/19 1514  BP: 108/62  Temp: 98.6 F (37 C)  Weight: 161 lb (73 kg)    Fetal Status: Fetal Heart Rate (bpm): 140 Fundal Height: 37 cm Movement: Present  Presentation: Vertex  General:  Alert, oriented and cooperative. Patient is in no acute distress.  Skin: Skin is warm and dry. No rash noted.   Cardiovascular: Normal heart rate noted  Respiratory: Normal respiratory effort, no problems with respiration noted  Abdomen: Soft, gravid, appropriate for gestational age.  Pain/Pressure: Absent     Pelvic: Cervical exam deferred        Extremities: Normal range of motion.  Edema: None  Mental Status: Normal mood and affect. Normal behavior. Normal judgment and thought content.   Assessment and Plan:  Pregnancy: [id number] at [redacted]w[redacted]d  1. Low-lying placenta Counseled pt re: potential risks of delivery if placenta is covering os (hemorrhage, morbidity and mortality for mom & baby.) Pt agrees to keep rescheduled [place] for placenta location as sched on 04/16/19.  2. Supervision  of high risk pregnancy, antepartum Reviewed neg test results from recent visit with pt. F/u 1 wk for next prenatal visit.    Preterm labor symptoms and general obstetric precautions including but not limited to vaginal bleeding, contractions, leaking of fluid and fetal movement were reviewed in detail with the patient. Please refer to After Visit Summary for other counseling recommendations.  Return in about 1 week (around 04/17/2019) for Routine prenatal care.  Future Appointments  Date Time Provider [place]  04/16/2019  1:00 PM [facility]    Lora Havens, [place]

## 2019-04-10 NOTE — Progress Notes (Signed)
In for visit; taking PNV (more given today); rescheduled u/s appt: 04/16/19 @ 1:00 Debera Lat, RN

## 2019-04-16 ENCOUNTER — Ambulatory Visit
Admission: RE | Admit: 2019-04-16 | Discharge: 2019-04-16 | Disposition: A | Discharge status: Home / Self Care | Payer: Self-pay | Source: Ambulatory Visit | Attending: Advanced Practice Midwife | Admitting: Advanced Practice Midwife

## 2019-04-16 ENCOUNTER — Other Ambulatory Visit: Payer: Self-pay

## 2019-04-16 DIAGNOSIS — O4440 Low lying placenta NOS or without hemorrhage, unspecified trimester: Secondary | ICD-10-CM | POA: Insufficient documentation

## 2019-04-16 DIAGNOSIS — O99810 Abnormal glucose complicating pregnancy: Secondary | ICD-10-CM | POA: Insufficient documentation

## 2019-04-16 DIAGNOSIS — Z3689 Encounter for other specified antenatal screening: Secondary | ICD-10-CM | POA: Insufficient documentation

## 2019-04-17 ENCOUNTER — Ambulatory Visit: Payer: Self-pay | Admitting: Physician Assistant

## 2019-04-17 ENCOUNTER — Encounter: Payer: Self-pay | Admitting: Physician Assistant

## 2019-04-17 DIAGNOSIS — O4440 Low lying placenta NOS or without hemorrhage, unspecified trimester: Secondary | ICD-10-CM

## 2019-04-17 DIAGNOSIS — O0990 Supervision of high risk pregnancy, unspecified, unspecified trimester: Secondary | ICD-10-CM

## 2019-04-17 DIAGNOSIS — O99810 Abnormal glucose complicating pregnancy: Secondary | ICD-10-CM

## 2019-04-17 NOTE — Progress Notes (Signed)
   PRENATAL VISIT NOTE  Subjective:  Brooke Anthony is a 38 y.o. T2I7124 at [redacted]w[redacted]d being seen today for ongoing prenatal care.  She is currently monitored for the following issues for this high-risk pregnancy and has Supervision of high risk pregnancy, antepartum; Tobacco abuse; Abnormal glucose tolerance in pregnancy; Group B streptococcal infection in pregnancy; Late prenatal care affecting pregnancy, antepartum; Low-lying placenta; UTI in pregnancy; and Elderly multigravida, currently pregnant on their problem list.  Patient reports no complaints.  Contractions: Not present. Vag. Bleeding: None.  Movement: Present. Denies leaking of fluid/ROM.   The following portions of the patient's history were reviewed and updated as appropriate: allergies, current medications, past family history, past medical history, past social history, past surgical history and problem list. Problem list updated.  Objective:   Vitals:   04/17/19 1043  BP: 114/69  Temp: 98.5 F (36.9 C)  Weight: 162 lb (73.5 kg)    Fetal Status: Fetal Heart Rate (bpm): 128 Fundal Height: 38 cm Movement: Present  Presentation: Vertex  General:  Alert, oriented and cooperative. Patient is in no acute distress.  Skin: Skin is warm and dry. No rash noted.   Cardiovascular: Normal heart rate noted  Respiratory: Normal respiratory effort, no problems with respiration noted  Abdomen: Soft, gravid, appropriate for gestational age.  Pain/Pressure: Absent     Pelvic: Cervical exam deferred        Extremities: Normal range of motion.  Edema: None  Mental Status: Normal mood and affect. Normal behavior. Normal judgment and thought content.   Assessment and Plan:  Pregnancy: P8K9983 at [redacted]w[redacted]d  1. Low-lying placenta Reviewed 04/16/19 US showing no placenta previa.  2. Abnormal glucose tolerance in pregnancy Reviewed 04/16/19 fetal growth US showing EFW 60%, AFI WNL. Fundal height appropriate today. Reinforced diet low in  concentrated sweets and carbohydrates. Rec frequent snacks rather than big meals.  3. Supervision of high risk pregnancy, antepartum Agrees to telehealth RV in 1 wk.   Term labor symptoms and general obstetric precautions including but not limited to vaginal bleeding, contractions, leaking of fluid and fetal movement were reviewed in detail with the patient. Please refer to After Visit Summary for other counseling recommendations.  Return in about 1 week (around 04/24/2019) for Telehealth, Routine prenatal care.  No future appointments.  Lora Havens, PA-C

## 2019-04-17 NOTE — Addendum Note (Signed)
Addended by: Cletis Media on: 04/17/2019 09:07 AM   Modules accepted: Orders

## 2019-04-17 NOTE — Progress Notes (Signed)
Patient here for maternity revisit at 61 2/7.Marland KitchenJenetta Downer, RN

## 2019-04-24 ENCOUNTER — Ambulatory Visit: Payer: Self-pay

## 2019-04-28 ENCOUNTER — Telehealth: Payer: Self-pay

## 2019-04-28 ENCOUNTER — Ambulatory Visit: Payer: Self-pay | Admitting: Advanced Practice Midwife

## 2019-04-28 NOTE — Telephone Encounter (Signed)
Attempted to call for [facility] Telehealth visit; no answer, left voicemail message Debera Lat, RN

## 2019-04-28 NOTE — Telephone Encounter (Signed)
Attempt x 2-left message to call for rescheduling appt. Debera Lat, RN

## 2019-04-30 ENCOUNTER — Telehealth: Payer: Self-pay

## 2019-04-30 NOTE — Telephone Encounter (Signed)
Attempted to call-no answer, left voicemail message that 05/01/19 appt will not be Telehealth but will need to come in to clinic for BP & cervical check Debera Lat, RN

## 2019-05-01 ENCOUNTER — Ambulatory Visit: Payer: Self-pay

## 2019-05-01 ENCOUNTER — Telehealth: Payer: Self-pay

## 2019-05-01 NOTE — Telephone Encounter (Signed)
Attempted to call regarding appt.-no answer & "voicemail box full" @ mobile#; left voicemail @ home # Debera Lat, RN

## 2019-05-02 ENCOUNTER — Other Ambulatory Visit: Payer: Self-pay

## 2019-05-02 ENCOUNTER — Ambulatory Visit: Payer: Self-pay | Admitting: Advanced Practice Midwife

## 2019-05-02 DIAGNOSIS — O0990 Supervision of high risk pregnancy, unspecified, unspecified trimester: Secondary | ICD-10-CM

## 2019-05-02 DIAGNOSIS — O2343 Unspecified infection of urinary tract in pregnancy, third trimester: Secondary | ICD-10-CM

## 2019-05-02 DIAGNOSIS — O4440 Low lying placenta NOS or without hemorrhage, unspecified trimester: Secondary | ICD-10-CM

## 2019-05-02 NOTE — Progress Notes (Signed)
In for visit; denies hospital visits; desires flu vaccine today Krupa Stege, RN  

## 2019-05-02 NOTE — Addendum Note (Signed)
Addended by: Debera Lat on: 05/02/2019 10:59 AM   Modules accepted: Orders

## 2019-05-02 NOTE — Progress Notes (Signed)
   PRENATAL VISIT NOTE  Subjective:  Brooke Anthony is a 38 y.o. [id number] at [redacted]w[redacted]d being seen today for ongoing prenatal care.  She is currently monitored for the following issues for this high-risk pregnancy and has Supervision of high risk pregnancy, antepartum; Tobacco abuse; Abnormal glucose tolerance in pregnancy; Group B streptococcal infection in pregnancy; Late prenatal care affecting pregnancy, antepartum; Low-lying placenta; UTI in pregnancy; and Elderly multigravida, currently pregnant on their problem list.  Patient reports fatigue.  Contractions: Not present. Vag. Bleeding: None.  Movement: Present. Denies leaking of fluid/ROM.   The following portions of the patient's history were reviewed and updated as appropriate: allergies, current medications, past family history, past medical history, past social history, past surgical history and problem list. Problem list updated.  Objective:   Vitals:   05/02/19 1004  BP: 114/70  Temp: 98.8 F (37.1 C)  Weight: 169 lb 6.4 oz (76.8 kg)    Fetal Status: Fetal Heart Rate (bpm): 150 Fundal Height: 39 cm Movement: Present  Presentation: Vertex  General:  Alert, oriented and cooperative. Patient is in no acute distress.  Skin: Skin is warm and dry. No rash noted.   Cardiovascular: Normal heart rate noted  Respiratory: Normal respiratory effort, no problems with respiration noted  Abdomen: Soft, gravid, appropriate for gestational age.  Pain/Pressure: Absent     Pelvic: Cervical exam performed      very posterior, soft, 60%, FT, vtx high  Extremities: Normal range of motion.  Edema: Trace  Mental Status: Normal mood and affect. Normal behavior. Normal judgment and thought content.   Assessment and Plan:  Pregnancy: [id number] at [redacted]w[redacted]d    1. Low-lying placenta Resolved  2. Supervision of high risk pregnancy, antepartum Has car seat.  Working 60 hrs/wk.  Knows when to go to L&D.  IOL paperwork completed.  Desires IOL  05/20/19   Term labor symptoms and general obstetric precautions including but not limited to vaginal bleeding, contractions, leaking of fluid and fetal movement were reviewed in detail with the patient. Please refer to After Visit Summary for other counseling recommendations.  Return in about 1 week (around 05/09/2019).  No future appointments.  Herbie Saxon, CNM

## 2019-05-09 ENCOUNTER — Other Ambulatory Visit: Payer: Self-pay

## 2019-05-09 ENCOUNTER — Ambulatory Visit: Payer: Self-pay | Admitting: Advanced Practice Midwife

## 2019-05-09 ENCOUNTER — Other Ambulatory Visit: Payer: Self-pay | Admitting: Obstetrics & Gynecology

## 2019-05-09 ENCOUNTER — Telehealth: Payer: Self-pay

## 2019-05-09 DIAGNOSIS — O0990 Supervision of high risk pregnancy, unspecified, unspecified trimester: Secondary | ICD-10-CM

## 2019-05-09 DIAGNOSIS — O0930 Supervision of pregnancy with insufficient antenatal care, unspecified trimester: Secondary | ICD-10-CM

## 2019-05-09 NOTE — Progress Notes (Signed)
   PRENATAL VISIT NOTE  Subjective:  Brooke Anthony is a 38 y.o. [id number] at [redacted]w[redacted]d being seen today for ongoing prenatal care.  She is currently monitored for the following issues for this high-risk pregnancy and has Supervision of high risk pregnancy, antepartum; Tobacco abuse; Abnormal glucose tolerance in pregnancy; Group B streptococcal infection in pregnancy; Late prenatal care affecting pregnancy, antepartum; Low-lying placenta--resolved 04/16/19; UTI in pregnancy; and Elderly multigravida, currently pregnant on their problem list.  Patient reports no complaints.  Contractions: Not present. Vag. Bleeding: None.  Movement: Present. Denies leaking of fluid/ROM.   The following portions of the patient's history were reviewed and updated as appropriate: allergies, current medications, past family history, past medical history, past social history, past surgical history and problem list. Problem list updated.  Objective:   Vitals:   05/09/19 1017  BP: 122/75  Temp: 97.9 F (36.6 C)  Weight: 169 lb 3.2 oz (76.7 kg)    Fetal Status: Fetal Heart Rate (bpm): 120 Fundal Height: 40 cm Movement: Present  Presentation: Vertex  General:  Alert, oriented and cooperative. Patient is in no acute distress.  Skin: Skin is warm and dry. No rash noted.   Cardiovascular: Normal heart rate noted  Respiratory: Normal respiratory effort, no problems with respiration noted  Abdomen: Soft, gravid, appropriate for gestational age.  Pain/Pressure: Absent     Pelvic: Cervical exam deferred        Extremities: Normal range of motion.  Edema: None  Mental Status: Normal mood and affect. Normal behavior. Normal judgment and thought content.   Assessment and Plan:  Pregnancy: [id number] at [redacted]w[redacted]d   1. Late prenatal care affecting pregnancy, antepartum   2. Supervision of high risk pregnancy, antepartum Knows when to go to L&D.  Has car seat.  Pt requested IOL on 05/20/19 and paperwork completed at last  apt (posterior, soft, 60%/FT); paperwork not faxed until 05/06/19; RN called [facility] due to no response yet ("MD out of office").  Smoking 10 cpd--counseled via 5 A's to stop smoking.   Term labor symptoms and general obstetric precautions including but not limited to vaginal bleeding, contractions, leaking of fluid and fetal movement were reviewed in detail with the patient. Please refer to After Visit Summary for other counseling recommendations.  No follow-ups on file.  No future appointments.  Herbie Saxon, CNM

## 2019-05-09 NOTE — Progress Notes (Signed)
Patient here for maternity visit at 40 3/7. [facility] IOL date still pending. Patient has questions about accuracy of dating.Jenetta Downer, RN

## 2019-05-09 NOTE — Telephone Encounter (Signed)
TC to patient. LM on VM to please call back asap. [facility] IOL scheduled by Dr. Kenton Kingfisher for 05/12/2019 at 9:00am, and they will do covid test when patient arrives. TC to patient's work place and spoke with patient. Patient agrees to 05/12/2019 induction of labor. [facility] appointment for 05/12/2019 to be cancelled by RN and patient aware.Jenetta Downer, RN

## 2019-05-12 ENCOUNTER — Inpatient Hospital Stay
Admission: RE | Admit: 2019-05-12 | Discharge: 2019-05-14 | DRG: 807 | Disposition: A | Discharge status: Home / Self Care | Payer: Self-pay | Attending: Maternal Newborn | Admitting: Maternal Newborn

## 2019-05-12 ENCOUNTER — Ambulatory Visit: Payer: Self-pay

## 2019-05-12 ENCOUNTER — Other Ambulatory Visit: Payer: Self-pay

## 2019-05-12 DIAGNOSIS — O98819 Other maternal infectious and parasitic diseases complicating pregnancy, unspecified trimester: Secondary | ICD-10-CM

## 2019-05-12 DIAGNOSIS — Z87891 Personal history of nicotine dependence: Secondary | ICD-10-CM

## 2019-05-12 DIAGNOSIS — O0930 Supervision of pregnancy with insufficient antenatal care, unspecified trimester: Secondary | ICD-10-CM

## 2019-05-12 DIAGNOSIS — O99810 Abnormal glucose complicating pregnancy: Secondary | ICD-10-CM

## 2019-05-12 DIAGNOSIS — O99824 Streptococcus B carrier state complicating childbirth: Secondary | ICD-10-CM | POA: Diagnosis present

## 2019-05-12 DIAGNOSIS — Z3A40 40 weeks gestation of pregnancy: Secondary | ICD-10-CM

## 2019-05-12 DIAGNOSIS — O480 Post-term pregnancy: Principal | ICD-10-CM | POA: Diagnosis present

## 2019-05-12 DIAGNOSIS — O0990 Supervision of high risk pregnancy, unspecified, unspecified trimester: Secondary | ICD-10-CM

## 2019-05-12 DIAGNOSIS — O99820 Streptococcus B carrier state complicating pregnancy: Secondary | ICD-10-CM

## 2019-05-12 DIAGNOSIS — Z3490 Encounter for supervision of normal pregnancy, unspecified, unspecified trimester: Secondary | ICD-10-CM | POA: Diagnosis present

## 2019-05-12 DIAGNOSIS — O4440 Low lying placenta NOS or without hemorrhage, unspecified trimester: Secondary | ICD-10-CM

## 2019-05-12 DIAGNOSIS — Z20828 Contact with and (suspected) exposure to other viral communicable diseases: Secondary | ICD-10-CM | POA: Diagnosis present

## 2019-05-12 DIAGNOSIS — B951 Streptococcus, group B, as the cause of diseases classified elsewhere: Secondary | ICD-10-CM

## 2019-05-12 DIAGNOSIS — O2343 Unspecified infection of urinary tract in pregnancy, third trimester: Secondary | ICD-10-CM

## 2019-05-12 LAB — CBC
HCT: 44.2 % (ref 36.0–46.0)
Hemoglobin: 14.6 g/dL (ref 12.0–15.0)
MCH: 29.4 pg (ref 26.0–34.0)
MCHC: 33 g/dL (ref 30.0–36.0)
MCV: 89.1 fL (ref 80.0–100.0)
Platelets: 174 10*3/uL (ref 150–400)
RBC: 4.96 MIL/uL (ref 3.87–5.11)
RDW: 14 % (ref 11.5–15.5)
WBC: 11.8 10*3/uL — ABNORMAL HIGH (ref 4.0–10.5)
nRBC: 0 % (ref 0.0–0.2)

## 2019-05-12 LAB — TYPE AND SCREEN
ABO/RH(D): O POS
Antibody Screen: NEGATIVE

## 2019-05-12 LAB — SARS CORONAVIRUS 2 BY RT PCR (HOSPITAL ORDER, PERFORMED IN ~~LOC~~ HOSPITAL LAB): SARS Coronavirus 2: NEGATIVE

## 2019-05-12 MED ORDER — LACTATED RINGERS IV SOLN: 500.0000 mL | INTRAVENOUS | Status: DC | PRN

## 2019-05-12 MED ORDER — TERBUTALINE SULFATE 1 MG/ML IJ SOLN: 0.2500 mg | Freq: Once | INTRAMUSCULAR | Status: DC | PRN

## 2019-05-12 MED ORDER — MISOPROSTOL 25 MCG QUARTER TABLET
25.0000 ug | ORAL_TABLET | ORAL | Status: DC | PRN
  Administered 2019-05-12 (×2): 25 ug via VAGINAL
  Filled 2019-05-12 (×3): qty 1

## 2019-05-12 MED ORDER — OXYTOCIN 40 UNITS IN NORMAL SALINE INFUSION - SIMPLE MED
1.0000 m[IU]/min | INTRAVENOUS | Status: DC
  Administered 2019-05-12: 2 m[IU]/min via INTRAVENOUS

## 2019-05-12 MED ORDER — PENICILLIN G 3 MILLION UNITS IVPB - SIMPLE MED
3.0000 10*6.[IU] | INTRAVENOUS | Status: DC
  Administered 2019-05-12 (×3): 3 10*6.[IU] via INTRAVENOUS
  Filled 2019-05-12 (×3): qty 100

## 2019-05-12 MED ORDER — MISOPROSTOL 200 MCG PO TABS
ORAL_TABLET | ORAL | Status: AC
  Filled 2019-05-12: qty 4

## 2019-05-12 MED ORDER — BUTORPHANOL TARTRATE 1 MG/ML IJ SOLN: 1.0000 mg | INTRAMUSCULAR | Status: DC | PRN

## 2019-05-12 MED ORDER — OXYTOCIN BOLUS FROM INFUSION
500.0000 mL | Freq: Once | INTRAVENOUS | Status: AC
  Administered 2019-05-13: 500 mL via INTRAVENOUS

## 2019-05-12 MED ORDER — OXYTOCIN 40 UNITS IN NORMAL SALINE INFUSION - SIMPLE MED
2.5000 [IU]/h | INTRAVENOUS | Status: DC
  Filled 2019-05-12 (×2): qty 1000

## 2019-05-12 MED ORDER — AMMONIA AROMATIC IN INHA
RESPIRATORY_TRACT | Status: AC
  Filled 2019-05-12: qty 10

## 2019-05-12 MED ORDER — SODIUM CHLORIDE 0.9 % IV SOLN
5.0000 10*6.[IU] | Freq: Once | INTRAVENOUS | Status: AC
  Administered 2019-05-12: 5 10*6.[IU] via INTRAVENOUS
  Filled 2019-05-12: qty 5

## 2019-05-12 MED ORDER — OXYTOCIN 10 UNIT/ML IJ SOLN
INTRAMUSCULAR | Status: AC
  Filled 2019-05-12: qty 2

## 2019-05-12 MED ORDER — ACETAMINOPHEN 325 MG PO TABS: 650.0000 mg | ORAL_TABLET | ORAL | Status: DC | PRN

## 2019-05-12 MED ORDER — ONDANSETRON HCL 4 MG/2ML IJ SOLN: 4.0000 mg | Freq: Four times a day (QID) | INTRAMUSCULAR | Status: DC | PRN

## 2019-05-12 MED ORDER — LIDOCAINE HCL (PF) 1 % IJ SOLN
30.0000 mL | INTRAMUSCULAR | Status: AC | PRN
  Administered 2019-05-13: 01:00:00 30 mL via SUBCUTANEOUS

## 2019-05-12 MED ORDER — LIDOCAINE HCL (PF) 1 % IJ SOLN
INTRAMUSCULAR | Status: AC
  Administered 2019-05-13: 30 mL via SUBCUTANEOUS
  Filled 2019-05-12: qty 30

## 2019-05-12 MED ORDER — LACTATED RINGERS IV SOLN
INTRAVENOUS | Status: DC
  Administered 2019-05-12 (×2): via INTRAVENOUS

## 2019-05-12 NOTE — Progress Notes (Signed)
Subjective:  Comfortable, sitting on ball. Feeling some pressure but no pain.  Objective:   Vitals: Blood pressure 121/74, pulse 81, temperature 98.5 F (36.9 C), temperature source Oral, resp. rate 17, height 5\' 1"  (1.549 m), weight 77.1 kg, last menstrual period 09/05/2018.  General: NAD Abdomen: gravid, non-tender Cervical Exam:  Dilation: Fingertip Effacement (%): 40, 50 Cervical Position: Posterior Station: -3 Exam by:: [employer]  FHT: Baseline 135 bpm, moderate variability, accelerations present, decelerations absent Toco: Occasional contractions, Duration: 80-140 seconds, Intensity: Mild  Results for orders placed or performed during the hospital encounter of 05/12/19 (from the past 24 hour(s))  SARS Coronavirus 2 by RT PCR (hospital order, performed in [facility] hospital lab) Nasopharyngeal Nasopharyngeal Swab     Status: None   Collection Time: 05/12/19 10:06 AM   Specimen: Nasopharyngeal Swab  Result Value Ref Range   SARS Coronavirus 2 NEGATIVE NEGATIVE  CBC     Status: Abnormal   Collection Time: 05/12/19 10:20 AM  Result Value Ref Range   WBC 11.8 (H) 4.0 - 10.5 K/uL   RBC 4.96 3.87 - 5.11 MIL/uL   Hemoglobin 14.6 12.0 - 15.0 g/dL   HCT 44.2 36.0 - 46.0 %   MCV 89.1 80.0 - 100.0 fL   MCH 29.4 26.0 - 34.0 pg   MCHC 33.0 30.0 - 36.0 g/dL   RDW 14.0 11.5 - 15.5 %   Platelets 174 150 - 400 K/uL   nRBC 0.0 0.0 - 0.2 %  Type and screen     Status: None   Collection Time: 05/12/19 10:20 AM  Result Value Ref Range   ABO/RH(D) O POS    Antibody Screen NEG    Sample Expiration      05/15/2019,2359 Performed at [facility], [address] [place], [place] [address]     Assessment:   38 y.o. P7T0626 [redacted]w[redacted]d here for a postdates induction of labor.  Plan:   1) Labor - Cervix essentially unchanged from last exam. Next dose of Cytotec placed.  2) Fetus - Vertex confirmed by bedside ultrasound. Fetal well-being reassuring, Category I  tracing.  Avel Sensor, CNM 05/12/2019  4:30 PM

## 2019-05-12 NOTE — Progress Notes (Signed)
Subjective:  Resting in bed, remains comfortable.  Objective:   Vitals: Blood pressure (!) 121/52, pulse 70, temperature 98.2 F (36.8 C), temperature source Oral, resp. rate 18, height 5\' 1"  (1.549 m), weight 77.1 kg, last menstrual period 09/05/2018.  General: NAD Abdomen: gravid, non-tender Cervical Exam:  Dilation: 2.5 Effacement (%): 60 Cervical Position: Posterior Station: -2 Presentation: Vertex Exam by:: Deeann Saint CNM  FHT: Baseline 145 bpm, moderate variability, accelerations present, decelerations absent. Toco: irregular contractions, mild  Results for orders placed or performed during the hospital encounter of 05/12/19 (from the past 24 hour(s))  SARS Coronavirus 2 by RT PCR (hospital order, performed in [facility] hospital lab) Nasopharyngeal Nasopharyngeal Swab     Status: None   Collection Time: 05/12/19 10:06 AM   Specimen: Nasopharyngeal Swab  Result Value Ref Range   SARS Coronavirus 2 NEGATIVE NEGATIVE  CBC     Status: Abnormal   Collection Time: 05/12/19 10:20 AM  Result Value Ref Range   WBC 11.8 (H) 4.0 - 10.5 K/uL   RBC 4.96 3.87 - 5.11 MIL/uL   Hemoglobin 14.6 12.0 - 15.0 g/dL   HCT 44.2 36.0 - 46.0 %   MCV 89.1 80.0 - 100.0 fL   MCH 29.4 26.0 - 34.0 pg   MCHC 33.0 30.0 - 36.0 g/dL   RDW 14.0 11.5 - 15.5 %   Platelets 174 150 - 400 K/uL   nRBC 0.0 0.0 - 0.2 %  Type and screen     Status: None   Collection Time: 05/12/19 10:20 AM  Result Value Ref Range   ABO/RH(D) O POS    Antibody Screen NEG    Sample Expiration      05/15/2019,2359 Performed at [facility], [address], [place], [place] [address]     Assessment:  38 y.o. [id number] [redacted]w[redacted]d here for a postdates labor induction.  Plan:   1) Labor - Start low dose Pitocin, titrate for adequate contraction pattern. Has completed adequate GBS prophylaxis, continue antibiotics as ordered  2) Fetus - Fetal well-being reassuring, Category I tracing.  Avel Sensor, CNM 05/12/2019   8:40 PM

## 2019-05-12 NOTE — H&P (Signed)
Obstetrics Admission History & Physical   Postdates induction of labor   HPI:  38 y.o. P5T6144 @ [redacted]w[redacted]d (05/06/2019, by Ultrasound). Admitted on 05/12/2019:   Patient Active Problem List   Diagnosis Date Noted  . Encounter for planned induction of labor 05/12/2019  . Post-dates pregnancy 05/12/2019  . Elderly multigravida, currently pregnant 04/10/2019  . UTI in pregnancy 03/27/2019  . Late prenatal care affecting pregnancy, antepartum 03/14/2019  . Low-lying placenta--resolved 04/16/19 03/14/2019  . Group B streptococcal infection in pregnancy 03/03/2019  . Abnormal glucose tolerance in pregnancy 02/28/2019  . Tobacco abuse 10 cpd 02/27/2019  . Supervision of high risk pregnancy, antepartum 02/10/2019     Presents for postdates induction of labor. Not having vaginal bleeding or loss of fluid. Not feeling any contractions. Baby is moving well. No complaints.   Prenatal care at: at [facility]. Pregnancy complicated by advanced maternal age, Group B strepUTI at new OB visit, tobacco use, one abnormal value on GTT (fasting=77, 1 hour=210, 2 hour=133, 3 hour=119). Counseled on dietary modifications but refused Lifestyles consult.  ROS: A review of systems was performed and negative, except as stated in the above HPI.  PMHx:  Past Medical History:  Diagnosis Date  . Abnormal glucose tolerance in pregnancy 02/28/2019   Needs 3 Hr Gtt  . Anemia   . Hx of migraines   . Labor and delivery, indication for care 08/04/2015  . Medical history non-contributory   . Postpartum care following vaginal delivery 08/06/2015  . UTI (urinary tract infection)    PSHx:  Past Surgical History:  Procedure Laterality Date  . NO PAST SURGERIES     Medications:  Medications Prior to Admission  Medication Sig Dispense Refill Last Dose  . Prenatal Vit-Fe Fumarate-FA (PRENATAL MULTIVITAMIN) TABS tablet Take 1 tablet by mouth daily at 12 noon.   05/11/2019 at Unknown time   Allergies: has No Known Allergies. OBHx:   OB History  Gravida Para Term Preterm AB Living  5 3 3   1 3   SAB TAB Ectopic Multiple Live Births    1   0 3    # Outcome Date GA Lbr Len/2nd Weight Sex Delivery Anes PTL Lv  5 Current           4 Term 08/04/15 [redacted]w[redacted]d 04:04 / 00:03 3460 g M Vag-Spont Local  LIV  3 Term 04/23/09 [redacted]w[redacted]d  3345 g F Vag-Spont   LIV  2 TAB 2006     TAB     1 Term 08/12/01 [redacted]w[redacted]d  2863 g M Vag-Spont   LIV   FHx:  Family History  Problem Relation Age of Onset  . Hypertension Mother   . Diabetes Father   . Epilepsy Sister   . Asthma Sister   . Bipolar disorder Sister    Soc Hx: Current smoker, Alcohol: none and Recreational drug use: none  Objective:   Vitals:   05/12/19 0938  BP: 129/81  Pulse: 82  Resp: 16  Temp: 98.3 F (36.8 C)   Constitutional: Well nourished, well developed female in no acute distress.  HEENT: normal Skin: Warm and dry.  Cardiovascular: Regular rate and rhythm.   Extremity: no edema, no signs of DVT Respiratory: Clear to auscultation bilaterally. Normal respiratory effort Abdomen: gravid, non-tender Neuro: Cranial nerves grossly intact Psych: Alert and Oriented x3. No memory deficits. Normal mood and affect.  MS: normal gait, normal bilateral lower extremity ROM/strength/stability.  Pelvic exam: is not limited by body habitus External Genitalia, Bartholin's glands,  Urethra, Skene's glands: within normal limits Vagina: within normal limits and with normal mucosa, no blood in the vault Cervix: FT/30-40/-3 Uterus: Occasional contractions, non-painful   EFM:FHR: 135 bpm, variability: moderate,  accelerations:  Present,  decelerations:  Absent Toco: Occasional contractions, mild   Perinatal info:  Blood type: O positive Rubella - Immune Varicella - Immune TDaP Given during third trimester of this pregnancy on 02/27/2019 RPR NR / HIV Neg/ HBsAg Neg   Assessment & Plan:   38 y.o. W2H8527 @ [redacted]w[redacted]d, Admitted on 05/12/2019 for a postdates induction of labor.  Begin  induction with Cytotec.  Antibiotics for GBS prophylaxis, Observe for cervical change, Fetal Wellbeing Reassuring, Epidural when ready, AROM when Appropriate.  Marcelyn Bruins, CNM [facility], [facility] 05/12/2019  12:09 PM

## 2019-05-13 ENCOUNTER — Ambulatory Visit: Payer: Self-pay

## 2019-05-13 DIAGNOSIS — O99824 Streptococcus B carrier state complicating childbirth: Secondary | ICD-10-CM

## 2019-05-13 DIAGNOSIS — O0993 Supervision of high risk pregnancy, unspecified, third trimester: Secondary | ICD-10-CM

## 2019-05-13 DIAGNOSIS — O480 Post-term pregnancy: Secondary | ICD-10-CM

## 2019-05-13 LAB — RPR: RPR Ser Ql: NONREACTIVE

## 2019-05-13 LAB — CBC
HCT: 41.6 % (ref 36.0–46.0)
Hemoglobin: 13.9 g/dL (ref 12.0–15.0)
MCH: 29.7 pg (ref 26.0–34.0)
MCHC: 33.4 g/dL (ref 30.0–36.0)
MCV: 88.9 fL (ref 80.0–100.0)
Platelets: 168 10*3/uL (ref 150–400)
RBC: 4.68 MIL/uL (ref 3.87–5.11)
RDW: 13.7 % (ref 11.5–15.5)
WBC: 17.1 10*3/uL — ABNORMAL HIGH (ref 4.0–10.5)
nRBC: 0 % (ref 0.0–0.2)

## 2019-05-13 MED ORDER — DIPHENHYDRAMINE HCL 25 MG PO CAPS: 25.0000 mg | ORAL_CAPSULE | Freq: Four times a day (QID) | ORAL | Status: DC | PRN

## 2019-05-13 MED ORDER — SIMETHICONE 80 MG PO CHEW: 80.0000 mg | CHEWABLE_TABLET | ORAL | Status: DC | PRN

## 2019-05-13 MED ORDER — SENNOSIDES-DOCUSATE SODIUM 8.6-50 MG PO TABS
2.0000 | ORAL_TABLET | Freq: Every day | ORAL | Status: DC
  Administered 2019-05-13: 2 via ORAL
  Filled 2019-05-13: qty 2

## 2019-05-13 MED ORDER — IBUPROFEN 600 MG PO TABS
600.0000 mg | ORAL_TABLET | Freq: Four times a day (QID) | ORAL | Status: DC
  Administered 2019-05-13 – 2019-05-14 (×7): 600 mg via ORAL
  Filled 2019-05-13 (×7): qty 1

## 2019-05-13 MED ORDER — COCONUT OIL OIL
1.0000 "application " | TOPICAL_OIL | Status: DC | PRN
  Administered 2019-05-13: 1 via TOPICAL
  Filled 2019-05-13: qty 120

## 2019-05-13 MED ORDER — OXYCODONE-ACETAMINOPHEN 5-325 MG PO TABS: 2.0000 | ORAL_TABLET | ORAL | Status: DC | PRN

## 2019-05-13 MED ORDER — ACETAMINOPHEN 325 MG PO TABS
650.0000 mg | ORAL_TABLET | ORAL | Status: DC | PRN
  Administered 2019-05-13 – 2019-05-14 (×5): 650 mg via ORAL
  Filled 2019-05-13 (×5): qty 2

## 2019-05-13 MED ORDER — OXYCODONE-ACETAMINOPHEN 5-325 MG PO TABS: 1.0000 | ORAL_TABLET | ORAL | Status: DC | PRN

## 2019-05-13 MED ORDER — WITCH HAZEL-GLYCERIN EX PADS: 1.0000 "application " | MEDICATED_PAD | CUTANEOUS | Status: DC | PRN

## 2019-05-13 MED ORDER — ONDANSETRON HCL 4 MG/2ML IJ SOLN: 4.0000 mg | INTRAMUSCULAR | Status: DC | PRN

## 2019-05-13 MED ORDER — ONDANSETRON HCL 4 MG PO TABS: 4.0000 mg | ORAL_TABLET | ORAL | Status: DC | PRN

## 2019-05-13 MED ORDER — METHYLERGONOVINE MALEATE 0.2 MG/ML IJ SOLN
0.2000 mg | Freq: Once | INTRAMUSCULAR | Status: AC
  Administered 2019-05-13: 02:00:00 0.2 mg via INTRAMUSCULAR

## 2019-05-13 MED ORDER — DIBUCAINE (PERIANAL) 1 % EX OINT: 1.0000 "application " | TOPICAL_OINTMENT | CUTANEOUS | Status: DC | PRN

## 2019-05-13 MED ORDER — METHYLERGONOVINE MALEATE 0.2 MG/ML IJ SOLN
INTRAMUSCULAR | Status: AC
  Administered 2019-05-13: 0.2 mg via INTRAMUSCULAR
  Filled 2019-05-13: qty 1

## 2019-05-13 MED ORDER — BENZOCAINE-MENTHOL 20-0.5 % EX AERO
1.0000 "application " | INHALATION_SPRAY | CUTANEOUS | Status: DC | PRN
  Administered 2019-05-13: 1 via TOPICAL
  Filled 2019-05-13: qty 56

## 2019-05-13 MED ORDER — PRENATAL MULTIVITAMIN CH
1.0000 | ORAL_TABLET | Freq: Every day | ORAL | Status: DC
  Administered 2019-05-13 – 2019-05-14 (×2): 1 via ORAL
  Filled 2019-05-13 (×2): qty 1

## 2019-05-13 NOTE — Discharge Instructions (Signed)
Discharge Instructions:   Follow-up Appointment: Schedule an appointment ASAP for a visit in 4 weeks at [facility]!   If there are any new medications, they have been ordered and will be available for pickup at the listed pharmacy on your way home from the hospital.   Call office if you have any of the following: headache, visual changes, fever >101.0 F, chills, shortness of breath, breast concerns, excessive vaginal bleeding, incision drainage or problems, leg pain or redness, depression or any other concerns. If you have vaginal discharge with an odor, let your doctor know.   It is normal to bleed for up to 6 weeks. You should not soak through more than 1 pad in 1 hour. If you have a blood clot larger than your fist with continued bleeding, call your doctor.   Activity: Do not lift > 10 lbs for 6 weeks (do not lift anything heavier than your baby). No intercourse, tampons, swimming pools, hot tubs, baths (only showers) for 6 weeks.  No driving for 1-2 weeks. Continue prenatal vitamin, especially if breastfeeding. Increase calories and fluids (water) while breastfeeding.   Your milk will come in, in the next couple of days (right now it is colostrum). You may have a slight fever when your milk comes in, but it should go away on its own.  If it does not, and rises above 101 F please call the doctor. You will also feel achy and your breasts will be firm. They will also start to leak. If you are breastfeeding, continue as you have been and you can pump/express milk for comfort.   If you have too much milk, your breasts can become engorged, which could lead to mastitis. This is an infection of the milk ducts. It can be very painful and you will need to notify your doctor to obtain a prescription for antibiotics. You can also treat it with a shower or hot/cold compress.   For concerns about your baby, please call your pediatrician.  For breastfeeding concerns, the lactation  consultant can be reached at [phone].   Postpartum blues (feelings of happy one minute and sad another minute) are normal for the first few weeks but if it gets worse let your doctor know.   Congratulations! We enjoyed caring for you and your new bundle of joy!

## 2019-05-13 NOTE — Lactation Note (Signed)
This note was copied from a baby's chart. Lactation Consultation Note  Patient Name: Brooke Anthony SEGBT'D Date: 05/13/2019 Reason for consult: Follow-up assessment  LC went in to follow-up with mom. [name] has been sleeping since last visit, but is beginning to show early hunger cues. Mom got baby out of bassinet, and into position for feeding in cross cradle position. LC assisted with bringing baby to breast, as mom began to lean forward to bring breast to baby. [name] was able to latch on her own and established a strong rhythmic sucking pattern, with light intermittent swallows. Mom reported no pain, only slight tugging sensation.  LC reviewed tips for keeping baby stimulated during feed, encouraging to let baby remove herself when full.  Praised mom for continuing with breastfeeding, and working on position and latch to minimize discomfort.  Encouraged to call out for [facility] support as needed.  Maternal Data Formula Feeding for Exclusion: No Has patient been taught Hand Expression?: Yes  Feeding Feeding Type: Breast Fed  LATCH Score Latch: Grasps breast easily, tongue down, lips flanged, rhythmical sucking.  Audible Swallowing: A few with stimulation  Type of Nipple: Everted at rest and after stimulation  Comfort (Breast/Nipple): Soft / non-tender  Hold (Positioning): Assistance needed to correctly position infant at breast and maintain latch.  LATCH Score: 8  Interventions Interventions: Breast feeding basics reviewed;Adjust position  Lactation Tools Discussed/Used     Consult Status Consult Status: PRN Date: 05/13/19 Follow-up type: In-patient    Lavonia Drafts 05/13/2019, 2:12 PM

## 2019-05-13 NOTE — Discharge Summary (Signed)
OB Discharge Summary     Patient Name: Brooke Anthony DOB: 1981/06/03 MRN: [id number]  Date of admission: 05/12/2019 Delivering Provider: Rexene Agent, CNM  Date of Delivery: 05/13/2019  Date of discharge: 05/14/2019  Admitting diagnosis: L and D Intrauterine pregnancy: [redacted]w[redacted]d     Secondary diagnosis: None     Discharge diagnosis: Term Pregnancy [place] [facility] course:  Induction of Labor With Vaginal Delivery   38 y.o. yo [id number] at [redacted]w[redacted]d was admitted to the hospital 05/12/2019 for induction of labor.  Indication for induction: Postdates.  Patient had an uncomplicated labor course as follows: Membrane Rupture Time/Date: 12:06 AM ,05/13/2019   Intrapartum Procedures: Episiotomy: None [1]                                         Lacerations:  2nd degree [3];Perineal [11]  Patient had delivery of a Viable infant.  Information for the patient's newborn:  Repsher, Girl Tiann [[id number]]  Delivery Method: Vag-Spont    05/13/2019  Details of delivery can be found in separate delivery note.  Patient had a routine postpartum course. Patient is discharged home 05/14/19.                                                                 Post partum procedures:none  Complications: None  Physical exam on 05/14/2019: Vitals:   05/13/19 1113 05/13/19 1552 05/13/19 2257 05/14/19 0833  BP: 106/64 119/60 119/77 133/84  Pulse: 74 66 66 78  Resp: 18 18 18 20   Temp: 98 F (36.7 C) 97.9 F (36.6 C) 98.6 F (37 C) 97.8 F (36.6 C)  TempSrc: Oral Oral Oral Oral  SpO2:   97% 98%  Weight:      Height:       General: alert, cooperative and no distress Lochia: appropriate Uterine Fundus: firm Incision: N/A DVT Evaluation: No evidence of DVT seen on physical exam.  Labs: Lab Results  Component Value Date   WBC 17.1 (H) 05/13/2019   HGB 13.9 05/13/2019   HCT 41.6 05/13/2019   MCV 88.9 05/13/2019   PLT 168 05/13/2019   CMP Latest Ref Rng &  Units 09/03/2017  Glucose 65 - 99 mg/dL 113(H)  BUN 6 - 20 mg/dL 19  Creatinine 0.44 - 1.00 mg/dL 0.68  Sodium 135 - 145 mmol/L 135  Potassium 3.5 - 5.1 mmol/L 4.0  Chloride 101 - 111 mmol/L 107  CO2 22 - 32 mmol/L 19(L)  Calcium 8.9 - 10.3 mg/dL 8.7(L)  Total Protein 6.5 - 8.1 g/dL 7.7  Total Bilirubin 0.3 - 1.2 mg/dL 0.5  Alkaline Phos 38 - 126 U/L 65  AST 15 - 41 U/L 26  ALT 14 - 54 U/L 38    Discharge instruction: per After Visit Summary.  Medications:  Allergies as of 05/14/2019   No Known Allergies     Medication List    TAKE these medications   prenatal multivitamin Tabs tablet Take 1 tablet by mouth daily at 12 noon.       Diet: routine  diet  Activity: Advance as tolerated. Pelvic rest for 6 weeks.   Outpatient follow up: Follow-up Information    [facility] DEPT. Schedule an appointment as soon as possible for a visit in 4 week(s).   Why: Postpartum visit Contact information: [address] [place] [address] [phone]            Postpartum contraception: IUD Mirena Rhogam Given postpartum: no Rubella vaccine given postpartum: no Varicella vaccine given postpartum: no TDaP given antepartum or postpartum: Yes  Newborn Data: Live born female "Brooke Anthony" Birth Weight: 7 lb, 9 oz  APGAR: 8, 9  Newborn Delivery   Birth date/time: 05/13/2019 00:17:00 Delivery type: Vaginal, Spontaneous       Baby Feeding: Breast  Disposition:home with mother  SIGNED:  Tresea Mall, CNM 05/14/2019 11:31 AM

## 2019-05-13 NOTE — Lactation Note (Signed)
This note was copied from a baby's chart. Lactation Consultation Note  Patient Name: Brooke Anthony UMPNT'I Date: 05/13/2019 Reason for consult: Initial assessment  LC in to see mom and baby 83. Mom reports breastfeeding has gone well, but has sore nipples. LC notes position of mom, leaning over baby at the end of this feed with Brooke Anthony continuing to cue.  RN has provided some breastfeeding basics, and given coconut oil for the sore nipples. [name] talks with mom about newborn stomach size, feeding patterns, position and latch, and early hunger cues. LC assists mom with bringing Brooke Anthony into football hold on right breast, turns baby in with nose to nipple, chin to breast, and demonstrates sandwiching of the breast, which mom is able to do independently. Brooke Anthony gives wide open mouth, and mom brings her in independently. Mom reports initial soreness that subsided within the first 20 seconds. Brooke Anthony demonstrated strong rhythmic sucking pattern, and had intermittent audible swallows. Throughout feed LC provided education on early feeding cues, signs of fullness, feeding patterns, milk supply and demand, and how to remove baby from breast without pain. LC demonstrated hand expression on left breast during feed; lots of colostrum evident, mom grateful to see the milk, fearing she didn't have enough. LC provided reassurance and importance of feeding on cues to bring in a plentiful milk supply. Brooke Anthony breastfed for 18 minutes on right breast, appearing content and sleepy post feed with relaxed hands and posture. [facility] name and number written on whiteboard, mom encouraged to call out with questions, concerns, or assistance.   Maternal Data Formula Feeding for Exclusion: No Has patient been taught Hand Expression?: Yes Does the patient have breastfeeding experience prior to this delivery?: Yes  Feeding Feeding Type: Breast Fed  LATCH Score Latch: Grasps breast easily, tongue down, lips flanged,  rhythmical sucking.  Audible Swallowing: A few with stimulation  Type of Nipple: Everted at rest and after stimulation  Comfort (Breast/Nipple): Soft / non-tender  Hold (Positioning): Assistance needed to correctly position infant at breast and maintain latch.  LATCH Score: 8  Interventions Interventions: Breast feeding basics reviewed;Hand express;Adjust position;Support pillows;Position options  Lactation Tools Discussed/Used     Consult Status Consult Status: Follow-up Date: 05/13/19 Follow-up type: In-patient    Lavonia Drafts 05/13/2019, 10:49 AM

## 2019-05-13 NOTE — Progress Notes (Signed)
Post Partum Day 0-11hours postpartum Subjective: Voiding without difficulty and tolerating a regular diet. Baby breast feeding frequently. Complains of sore nipples. Has coconut oil.  Objective: Blood pressure 122/68, pulse 70, temperature 98.8 F (37.1 C), temperature source Oral, resp. rate 18, height 5\' 1"  (1.549 m), weight 77.1 kg, last menstrual period 09/05/2018, SpO2 97 %, unknown if currently breastfeeding.  Physical Exam:  General: alert, cooperative and no distress Lochia: appropriate Uterine Fundus: firm/ U-2FB/ML/NT Perineum: not visualized due to sitting up breast feeding DVT Evaluation: No evidence of DVT seen on physical exam.  Recent Labs    05/12/19 1020 05/13/19 0532  HGB 14.6 13.9  HCT 44.2 41.6  WBC 11.8* 17.1*  PLT 174 168    Assessment/Plan: Stable 11 hours postpartum Continue postpartum care Support breast feeding.-Offer gel packs. O POS Breast/ Mirena TDAP UTD Probable discharge tomorrow   LOS: 1 day   Dalia Heading 05/13/2019, 10:53 AM

## 2019-05-14 NOTE — Progress Notes (Signed)
DC to home.  To car via wc by staff. 

## 2019-05-14 NOTE — Lactation Note (Signed)
This note was copied from a baby's chart. Lactation Consultation Note  Patient Name: Brooke Anthony NOMVE'H Date: 05/14/2019 Reason for consult: Follow-up assessment  LC checked in with mom and baby [name]. Mom reports multiple feeds overnight with baby not appearing satisfied.  Baby was actively feeding this morning, in cradle position. LC notes that baby had wide open mouth, flange lips, and had a strong rhythmic sucking.  Discussed with mom newborn stomach size, timing/onset of cluster feeding/growth spurts, early hunger cues, position/latch, and tips to keep infant stimulated while breastfeeding to ensure emptying. Provided education on importance of on demand feeds, milk supply and demand, and ongoing skin to skin. Reviewed with mom breastfeeding basics for the upcoming days, support with outpatient lactation consultations, and breastfeeding support groups. Encouraged mom to call out with any questions/concerns today.  Maternal Data Formula Feeding for Exclusion: No Has patient been taught Hand Expression?: Yes  Feeding Feeding Type: Breast Fed  LATCH Score Latch: Grasps breast easily, tongue down, lips flanged, rhythmical sucking.  Audible Swallowing: A few with stimulation  Type of Nipple: Everted at rest and after stimulation  Comfort (Breast/Nipple): Soft / non-tender  Hold (Positioning): No assistance needed to correctly position infant at breast.  LATCH Score: 9  Interventions Interventions: Breast feeding basics reviewed;Breast massage  Lactation Tools Discussed/Used     Consult Status Consult Status: PRN Date: 05/14/19 Follow-up type: Call as needed    Lavonia Drafts 05/14/2019, 9:20 AM

## 2019-05-14 NOTE — Progress Notes (Signed)
DC inst reviewed with mom.  Discussed red flag warning s/s of when to notify provider or ER.  Pt verb u/o. And knows of her f/u appointment.

## 2019-06-02 ENCOUNTER — Ambulatory Visit: Payer: Self-pay

## 2019-06-10 ENCOUNTER — Encounter: Payer: Self-pay | Admitting: Advanced Practice Midwife

## 2019-06-10 ENCOUNTER — Ambulatory Visit (LOCAL_COMMUNITY_HEALTH_CENTER): Payer: Self-pay | Admitting: Family Medicine

## 2019-06-10 ENCOUNTER — Other Ambulatory Visit: Payer: Self-pay

## 2019-06-10 DIAGNOSIS — Z30430 Encounter for insertion of intrauterine contraceptive device: Secondary | ICD-10-CM

## 2019-06-10 DIAGNOSIS — Z975 Presence of (intrauterine) contraceptive device: Secondary | ICD-10-CM

## 2019-06-10 DIAGNOSIS — Z3009 Encounter for other general counseling and advice on contraception: Secondary | ICD-10-CM

## 2019-06-10 MED ORDER — LEVONORGESTREL 20 MCG/24HR IU IUD
1.0000 | INTRAUTERINE_SYSTEM | Freq: Once | INTRAUTERINE | Status: AC
  Administered 2019-06-10: 1 via INTRAUTERINE

## 2019-06-10 NOTE — Progress Notes (Signed)
Post Partum Exam  Brooke Anthony is a 38 y.o. [id number] female who presents for a postpartum visit. She is 4 weeks postpartum following a spontaneous vaginal delivery. I have fully reviewed the prenatal and intrapartum course. The delivery was at 60 gestational weeks.  Anesthesia: none Postpartum course has been uncomplicated- some nipple pain that has needed a nipple shield to manage. Baby's course has been uncomplicated. Baby is feeding by breast and formula feeding. Mother pumps as well. Bleeding staining only. Bowel function is normal. Bladder function is normal. Patient is not sexually active. Contraception method is none. Desires IUD  Postpartum depression screening: Low risk   The following portions of the patient's history were reviewed and updated as appropriate: allergies, current medications, past family history, past medical history, past social history, past surgical history and problem list. Last pap smear done 02/10/2019 and was Normal  Review of Systems Pertinent items are noted in HPI.    Objective:  BP 111/69   Ht 5\' 2"  (1.575 m)   Wt 131 lb (59.4 kg)   LMP 09/05/2018 (Approximate) Comment: just had post delievery bleeding but no regular period since, just spotting  Breastfeeding Yes   BMI 23.96 kg/m   General:  alert, cooperative and appears stated age   Breasts:  no concerns today- discussed use of nipple shield  Lungs: normal WOB  Heart:  regular rate and rhythm, S1, S2 normal, no murmur, click, rub or gallop  Abdomen: soft non-tender   Vulva:  normal  Vagina: normal vagina  Cervix:  multiparous appearance  Corpus: not examined  Adnexa:  not evaluated  Rectal Exam: Not performed.         Patient presented to [facility] for IUD insertion. Her GC/CT screening was found to be up to date and last collected on 03/31/2019 and report no new partners since screening  and using WHO criteria we can be reasonably certain she is not pregnant since she is 4 wks form delivery  and has not has sexual activity. See Flowsheet for IUD check list  IUD Insertion Procedure Note Patient identified, informed consent performed, consent signed.   Discussed risks of irregular bleeding, cramping, infection, malpositioning or misplacement of the IUD outside the uterus which may require further procedure such as laparoscopy. Time out was performed.    Speculum placed in the vagina.  Cervix visualized.  Cleaned with Betadine x 3. Uterus sounded to 9.5 cm.  Mirena IUD placed per manufacturer's recommendations.  Strings trimmed to 3 cm.  Patient tolerated procedure well.   Patient was given post-procedure instructions- both agency handout and verbally by provider.  She was advised to have backup contraception for one week.  Patient was also asked to check IUD strings periodically or follow up in 4 weeks for IUD check.  Assessment:    Normal postpartum exam. Pap smear not done at today's visit.   Plan:   1. Contraception: IUD 2. Infant feeding:  patient is currently feeding with combination formula and breastmilk. 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 Recommended draining breast every 3 hours to keep supplyu 3. Mood: EPDS is low risk. Reviewed resources and that mood sx in first year after pregnancy are considered related to pregnancy and to reach out for help at [facility] if needed. Discussed [facility] as link to care and availability of LCSW for counseling  4. Chronic Medical Conditions: Tobacco- recommended continued efforts to stop smoking Recommended establishing PCP care to have regular DM screenings after age 6 given hx  of abnormal GTT    Patient given handout about PCP care in the community Given MVI per family planning program guidelines if desired  Follow up in: 1 year or as needed.

## 2019-06-10 NOTE — Progress Notes (Signed)
Pt here as she is interested in getting the Mirena IUD. Pt delivered her baby on 05/13/2019. Pt is breastfeeding. Pt reports she had the Mirena in the past and did not have any issues with it.Ronny Bacon, RN

## 2019-06-12 DIAGNOSIS — Z975 Presence of (intrauterine) contraceptive device: Secondary | ICD-10-CM | POA: Insufficient documentation

## 2021-01-23 IMAGING — US OBSTETRIC 14+ WK ULTRASOUND
1 series · 13 of 28 positions shown · non-contrast
Comparison: none

CLINICAL DATA: Pregnancy.  Fetal anatomy and dating.

EXAM:
OBSTETRICAL ULTRASOUND >14 WKS AND TRANSVAGINAL OB US

[Series 1: obstetric 14+ wk ultrasound · 0.28mm/px · 13 of 78 slices shown]
[im 3/78]
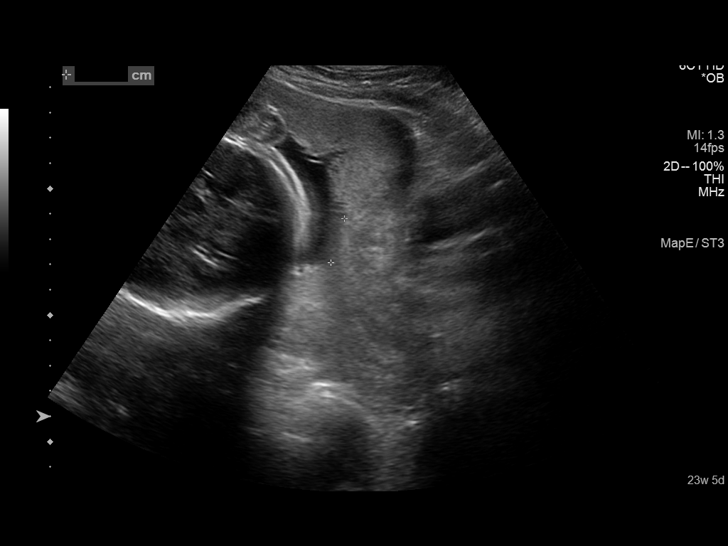
[im 9/78]
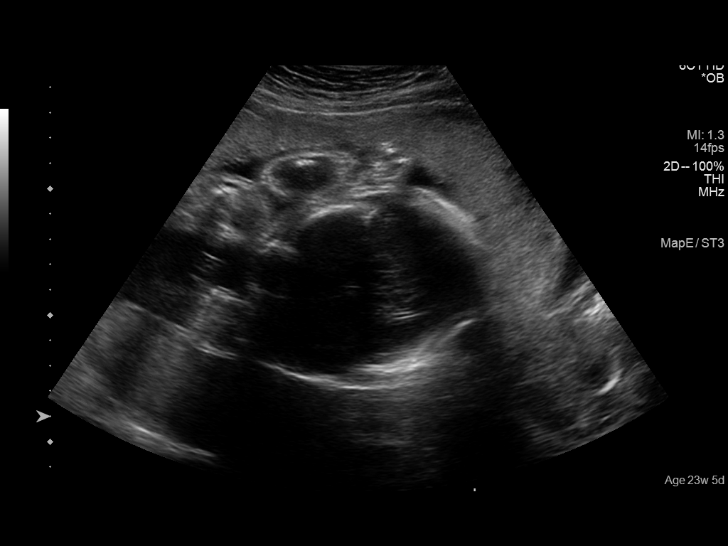
[im 15/78]
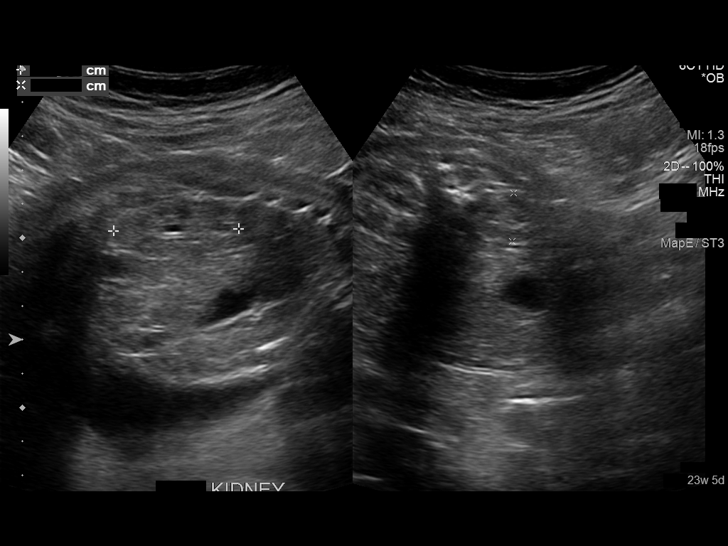
[im 20/78]
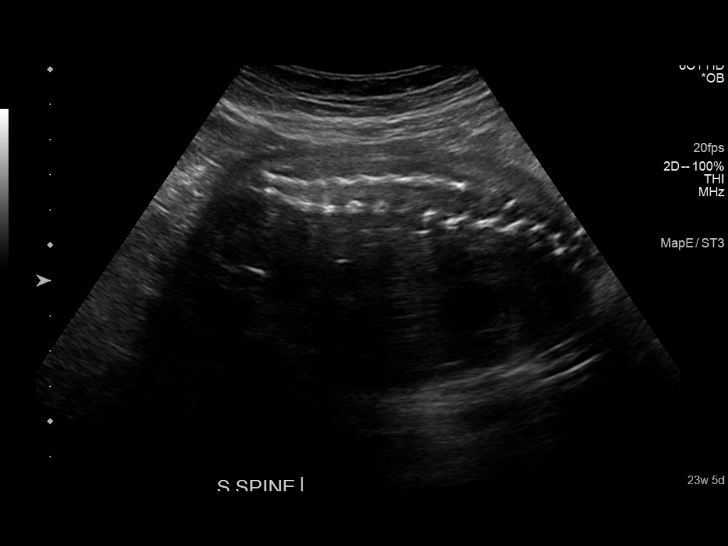
[im 26/78]
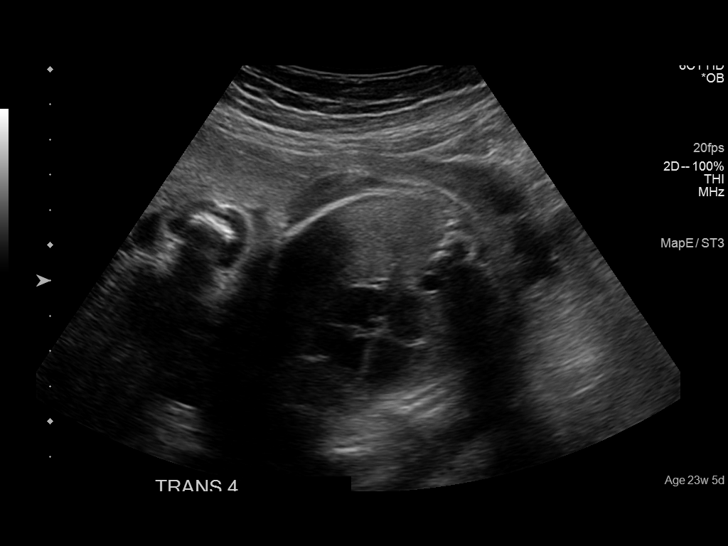
[im 32/78]
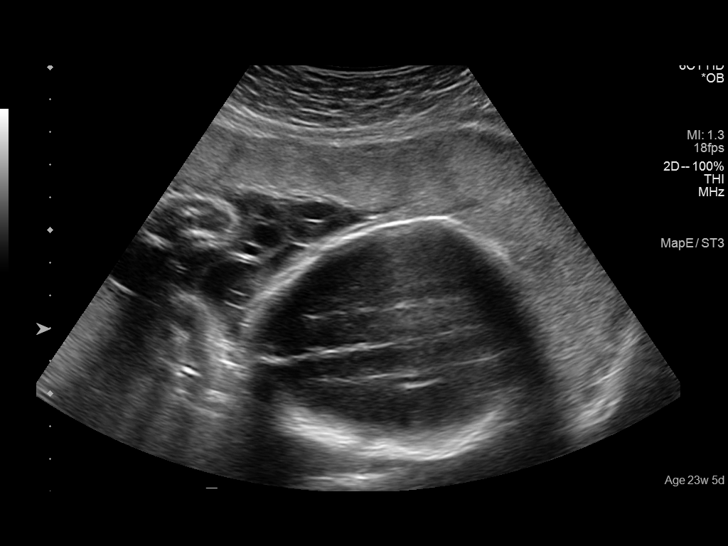
[im 40/78]
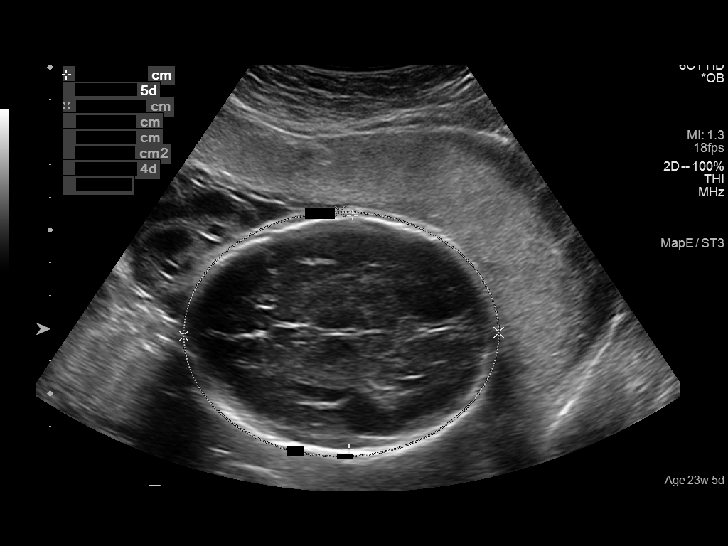
[im 46/78]
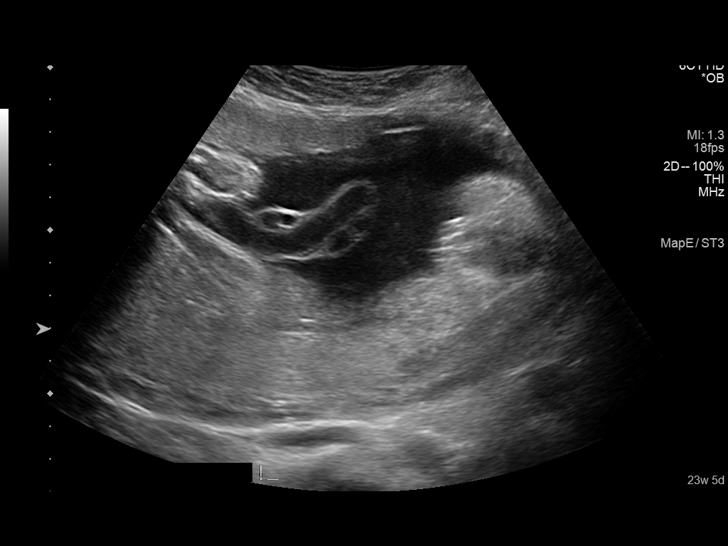
[im 52/78]
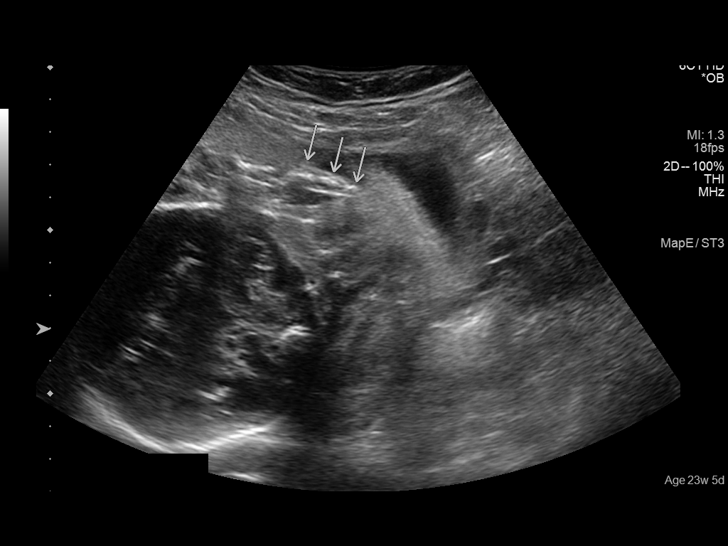
[im 58/78]
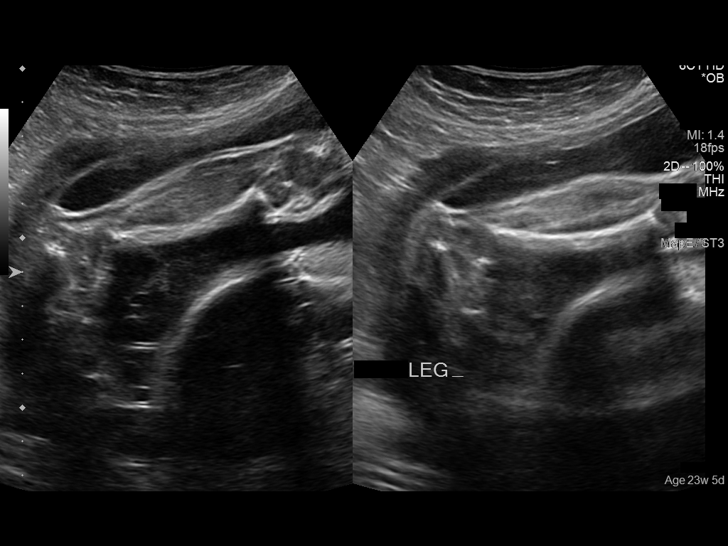
[im 63/78]
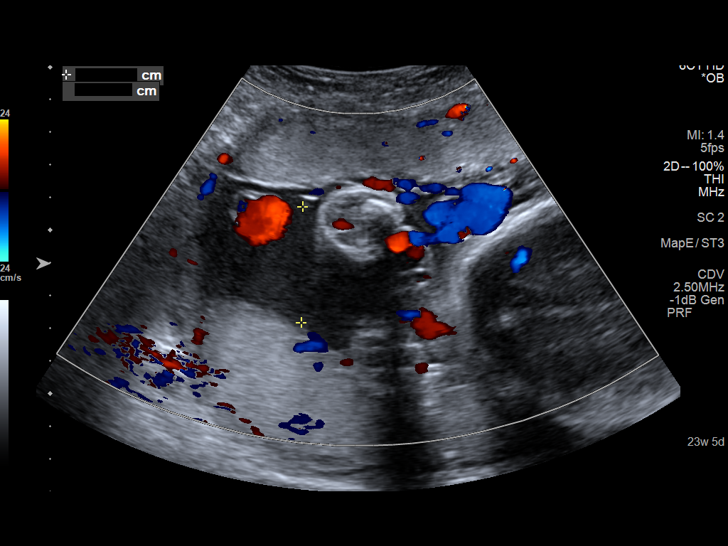
[im 69/78]
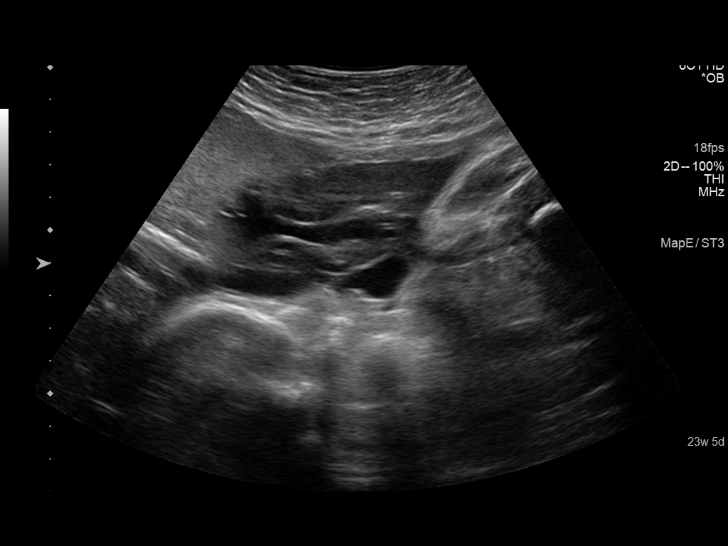
[im 75/78]
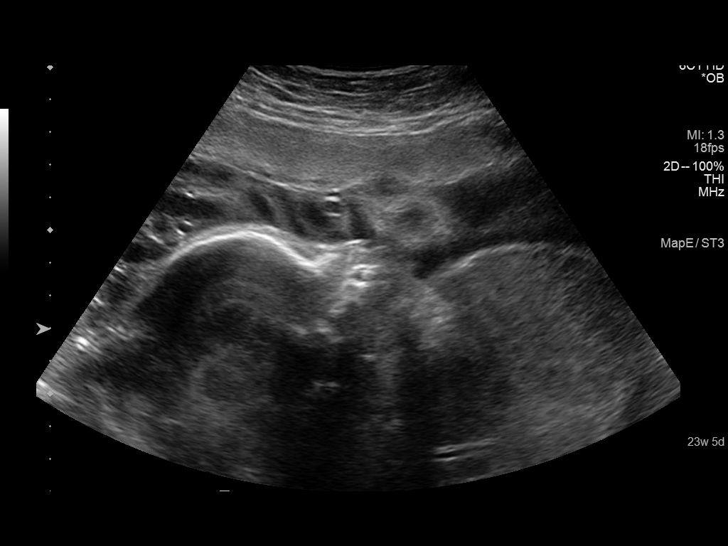

[13 of 28 positions shown; findings below may reference images not displayed]

FINDINGS: Number of Fetuses: 1

Heart Rate: 153 bpm

Movement: Present

Presentation: Cephalic

Previa: Low lying placenta 1.8 cm from the internal os. No previa
noted.

Placental Location: Anterior.

Amniotic Fluid (Subjective): Normal

Amniotic Fluid (Objective):

AFI 12.3 cm (5%ile= 9.2 cm, 95%= 23.1 cm for 29 wks)

FETAL BIOMETRY

BPD:  7.2cm 28w 5d

HC:    26.8cm 29w 2d

AC:    25.7cm 29w 6d

FL:    5.3cm 28w 2d

Current Mean GA: 29w 0d US EDC: 05/06/2019

Estimated Fetal Weight:  1,355g 45%ile

FETAL ANATOMY

Lateral Ventricles: Appears normal

Thalami/CSP: Appears normal

Posterior Fossa: Appears normal

Nuchal Region: Not visualized

Upper Lip: Appears normal

Spine: Appears normal

4 Chamber Heart on Left: Appears normal

LVOT: Appears normal

RVOT: Appears normal

Stomach on Left: Appears normal

3 Vessel Cord: Appears normal

Cord Insertion site: Appears normal

Kidneys: Appears normal

Bladder: Appears normal

Extremities: Appears normal

Technical Limitations: Advanced gestational age.

Maternal Findings:

Cervix:  5.5 cm and closed.
IMPRESSION: 1. Single viable intrauterine pregnancy at 29 weeks 0 days. Cephalic
presentation. Low lying anterior placenta. Placenta is 1.8 cm from
the internal os.

## 2021-03-21 IMAGING — US US OB FOLLOW-UP
1 series · 13 of 28 positions shown · non-contrast
Comparison: none

CLINICAL DATA: Abnormal maternal glucose tolerance test. Evaluate
fetal growth.

EXAM:
OBSTETRIC 14+ WK ULTRASOUND FOLLOW-UP

[Series 1: us ob follow-up · 0.23mm/px · 13 of 76 slices shown]
[im 3/76]
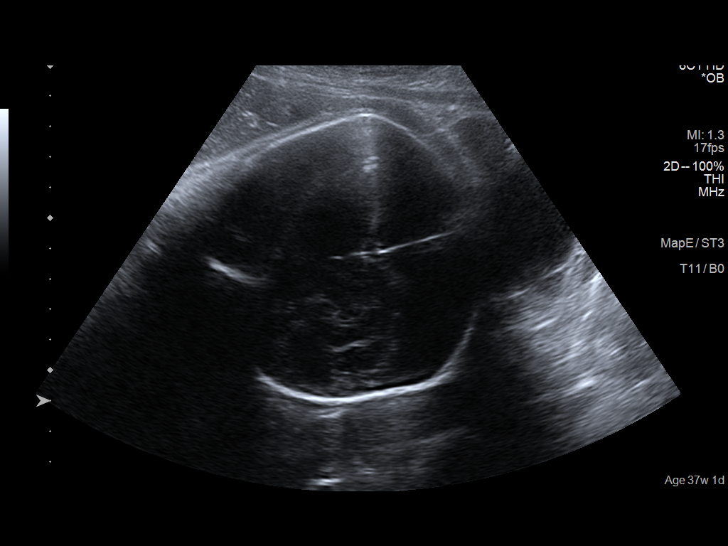
[im 9/76]
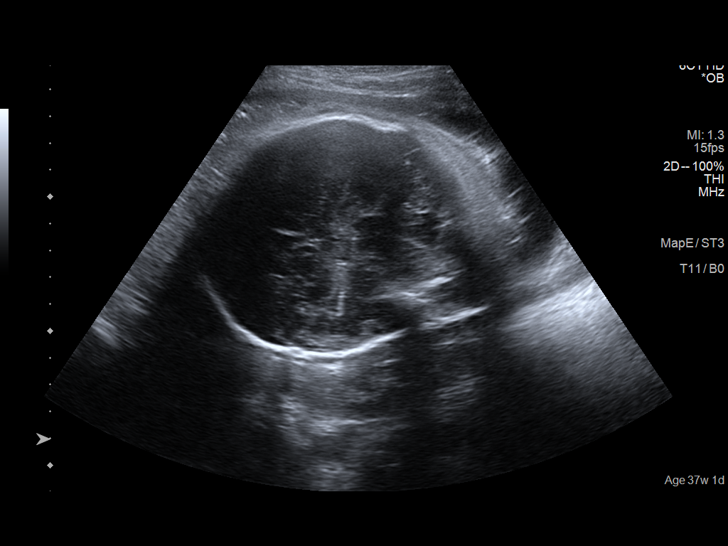
[im 14/76]
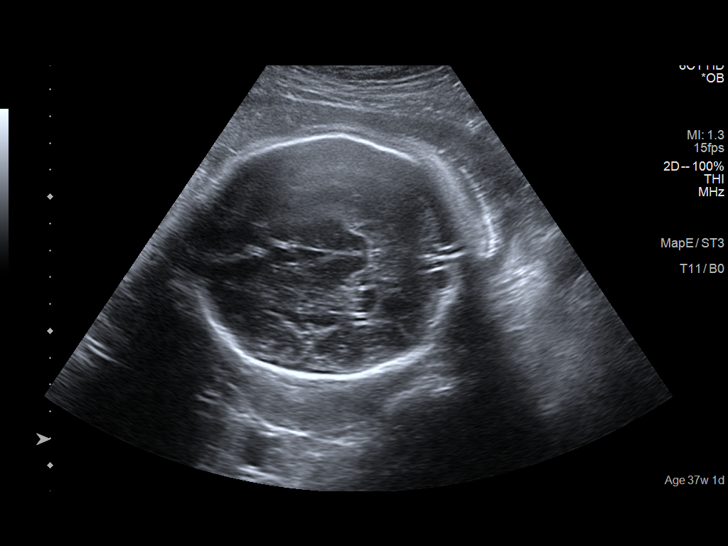
[im 20/76]
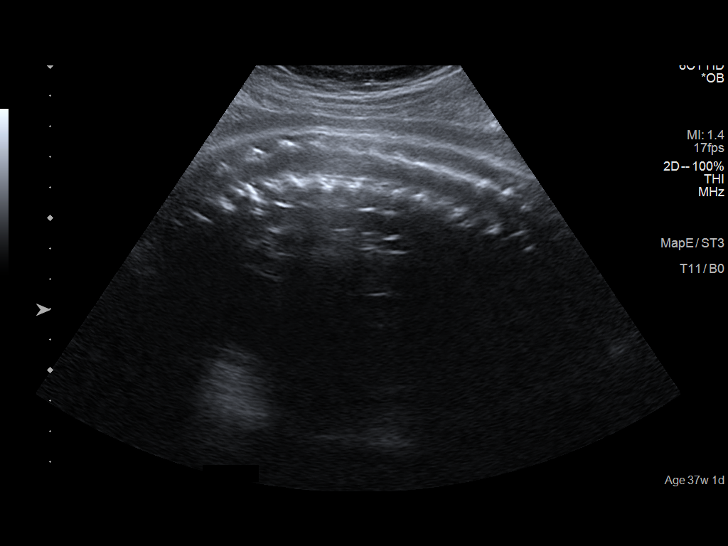
[im 26/76]
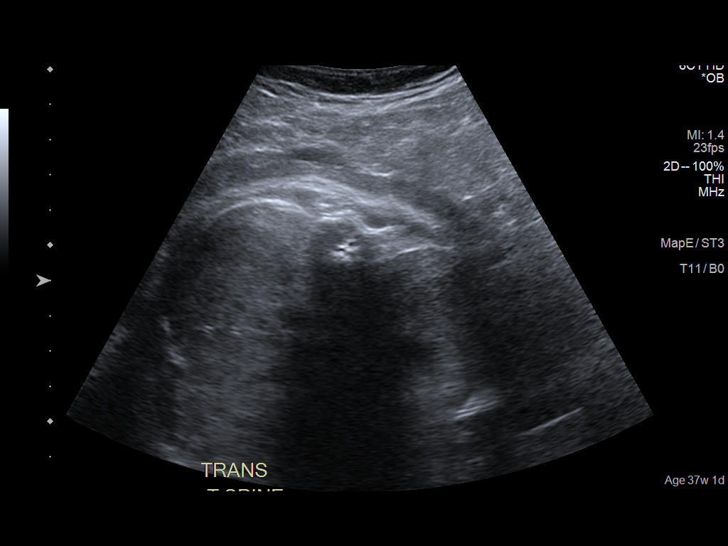
[im 31/76]
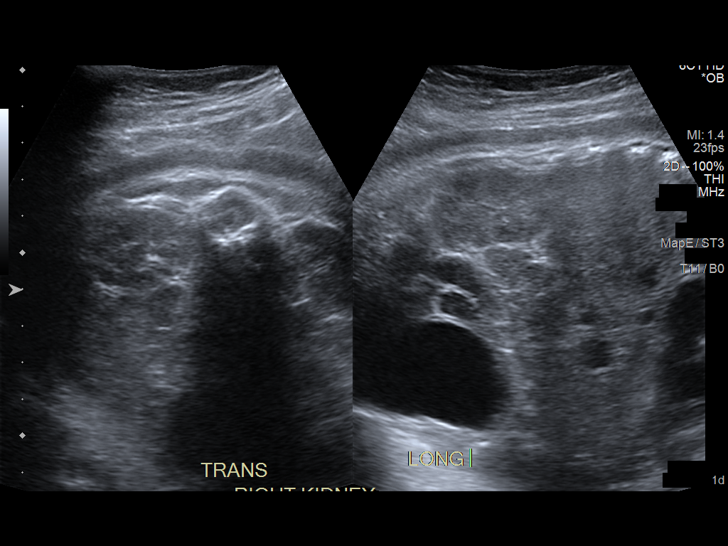
[im 39/76]
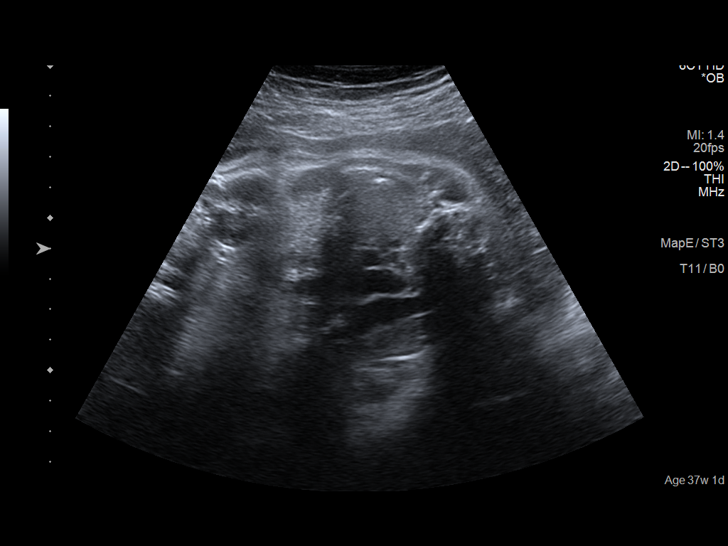
[im 45/76]
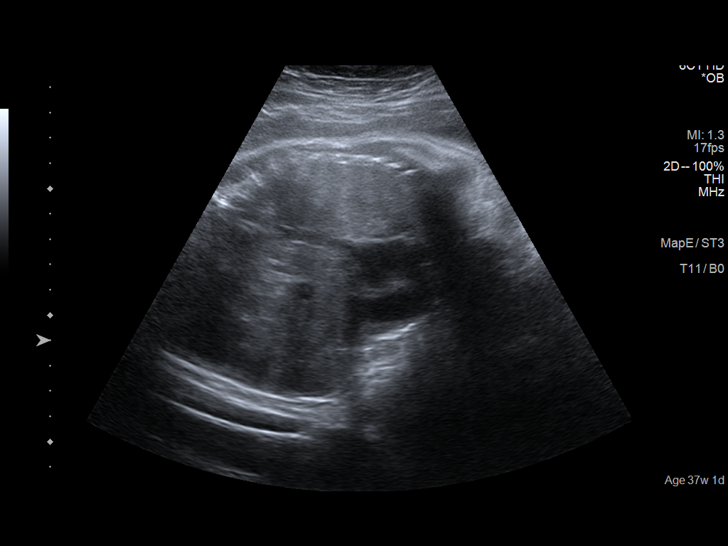
[im 51/76]
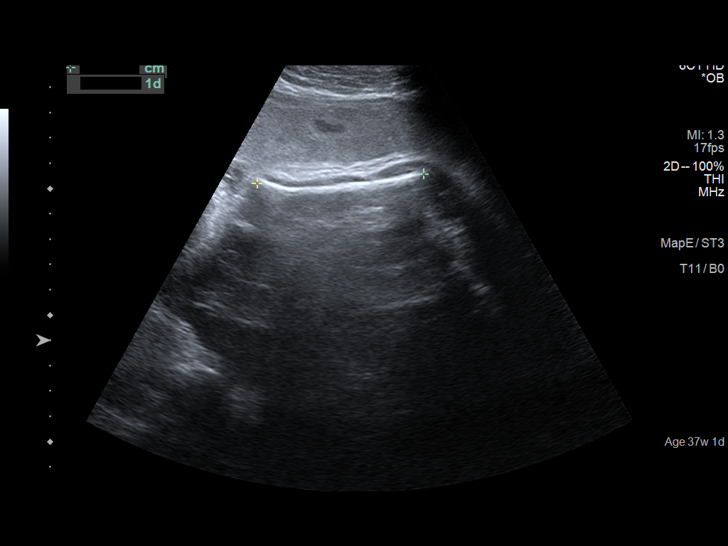
[im 56/76]
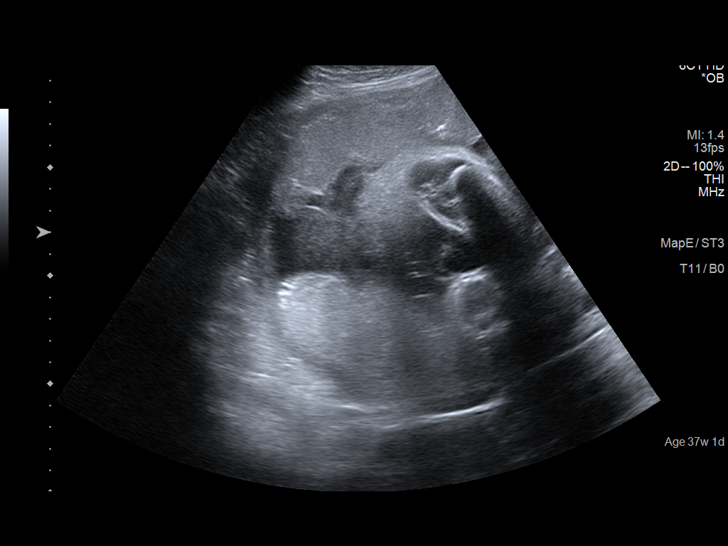
[im 62/76]
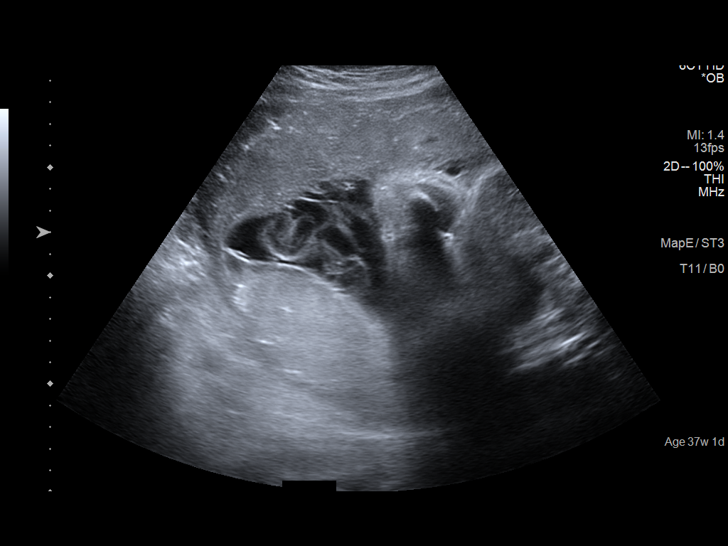
[im 67/76]
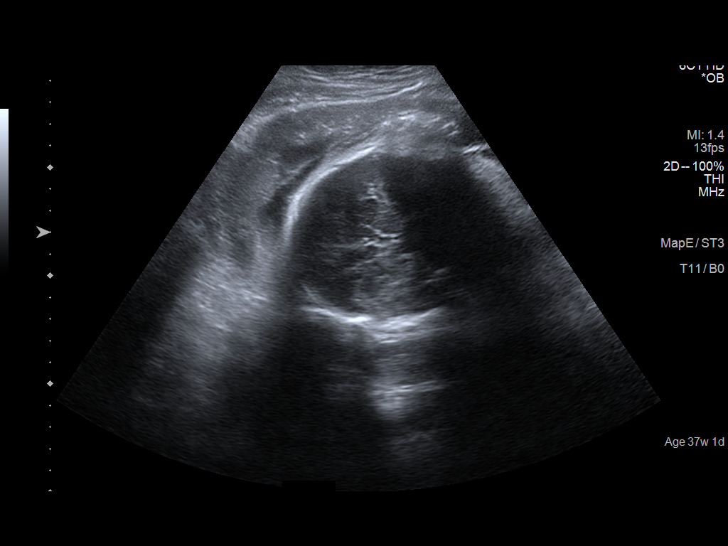
[im 73/76]
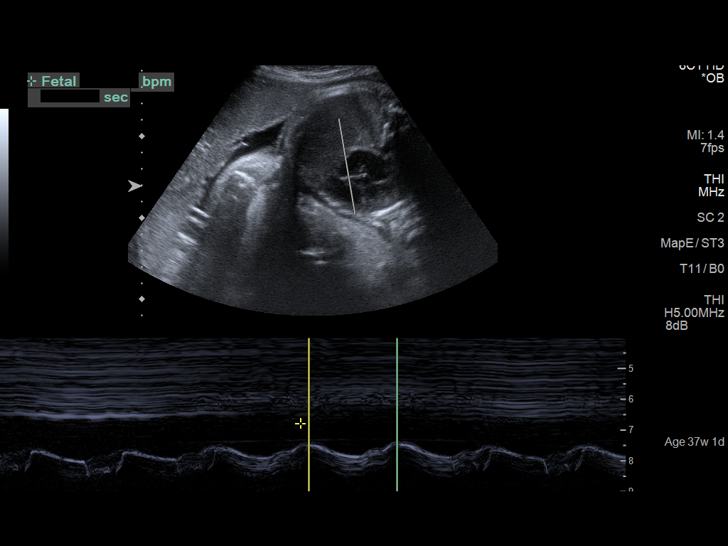

[13 of 28 positions shown; findings below may reference images not displayed]

FINDINGS: Number of Fetuses: 1

Heart Rate:  122 bpm

Movement: Yes

Presentation: Cephalic

Previa: No

Placental Location: Right lateral

Amniotic Fluid (Subjective): Within normal limits

Amniotic Fluid (Objective):

Vertical pocket 3.1cm

AFI 9.6 cm (5%ile= 7.5 cm, 95%= 24.4 cm for 37 wks)

FETAL BIOMETRY

BPD:  9.0cm 36w 2d

HC:    34.0cm 38w 6d

AC:    33.0cm 37w 1d

FL:    7.5cm 38w 3d

Current Mean GA: 37w 5d US EDC: 05/02/2019

Assigned GA: 37w 1d Assigned EDC: 05/06/2019

Estimated Fetal Weight:  3,240g 68%ile

FETAL ANATOMY

Lateral Ventricles: Appears normal

Thalami/CSP: Appears normal

Posterior Fossa: Appears normal

Nuchal Region: Not visualized

Upper Lip: Previously seen

Spine: Appears normal

4 Chamber Heart on Left: Appears normal

LVOT: Appears normal

RVOT: Appears normal

Stomach on Left: Appears normal

3 Vessel Cord: Previously seen

Cord Insertion site: Previously seen

Kidneys: Appears normal

Bladder: Appears normal

Extremities: Previously seen

Sex: Not visualized

Technical Limitations: Advanced gestational age and fetal position

Maternal Findings:

Cervix:  3.1 cm TA
IMPRESSION: Assigned GA currently 37 weeks 1 day. Appropriate fetal growth, with
EFW currently at 60 %ile.

Amniotic fluid volume within normal limits, with AFI of 9.6 cm.

## 2023-04-10 ENCOUNTER — Ambulatory Visit (LOCAL_COMMUNITY_HEALTH_CENTER): Payer: Self-pay

## 2023-04-10 ENCOUNTER — Other Ambulatory Visit: Payer: Self-pay

## 2023-04-10 DIAGNOSIS — Z111 Encounter for screening for respiratory tuberculosis: Secondary | ICD-10-CM

## 2023-04-10 LAB — HM HIV SCREENING LAB: HM HIV Screening: NEGATIVE

## 2023-04-10 NOTE — Progress Notes (Signed)
In nurse clinic requesting ppd. During visit, patient explains she has been in contact with a family member who has active TB. This interaction took place at a funeral 12/2022. Patient reports other family members with inactive TB. States she is here today for "peace of mind." No one has referred her for testing.  RN counseled patient and offered her a QFT gold test today at no charge and also HIV and RPR. Patient accepts all testing today.   Pt reports daily cough with clear mucus production which she has had for years and attributes it to daily smoking habit. Denies all other symptoms. Denies hx BCG. Moved to [place] from [place] 2003.   ROI signed. HIV consent signed. To clerk and refunded ppd payment. RN walked pt to lab for QFT, HIV, RPR. Patient plans to view results on MyChart. RN explained that she will be contacted with results as well. Questions answered and reports understanding. Jerel Shepherd, RN

## 2023-04-13 ENCOUNTER — Other Ambulatory Visit: Payer: Self-pay

## 2023-04-14 LAB — QUANTIFERON-TB GOLD PLUS
QuantiFERON Mitogen Value: >10 [IU]/mL
QuantiFERON Nil Value: 0 [IU]/mL
QuantiFERON TB1 Ag Value: 0 [IU]/mL
QuantiFERON TB2 Ag Value: 0.01 [IU]/mL
QuantiFERON-TB Gold Plus: NEGATIVE

## 2023-06-15 ENCOUNTER — Emergency Department
Admission: EM | Admit: 2023-06-15 | Discharge: 2023-06-15 | Disposition: A | Discharge status: Home / Self Care | Payer: Self-pay | Attending: Emergency Medicine | Admitting: Emergency Medicine

## 2023-06-15 ENCOUNTER — Emergency Department: Payer: Self-pay

## 2023-06-15 ENCOUNTER — Other Ambulatory Visit: Payer: Self-pay

## 2023-06-15 DIAGNOSIS — R0602 Shortness of breath: Secondary | ICD-10-CM | POA: Insufficient documentation

## 2023-06-15 DIAGNOSIS — R079 Chest pain, unspecified: Secondary | ICD-10-CM

## 2023-06-15 DIAGNOSIS — R0789 Other chest pain: Secondary | ICD-10-CM | POA: Insufficient documentation

## 2023-06-15 DIAGNOSIS — F17200 Nicotine dependence, unspecified, uncomplicated: Secondary | ICD-10-CM | POA: Insufficient documentation

## 2023-06-15 LAB — HEPATIC FUNCTION PANEL
ALT: 37 U/L (ref 0–44)
AST: 29 U/L (ref 15–41)
Albumin: 4.5 g/dL (ref 3.5–5.0)
Alkaline Phosphatase: 70 U/L (ref 38–126)
Bilirubin, Direct: 0.2 mg/dL (ref 0.0–0.2)
Indirect Bilirubin: 0.7 mg/dL (ref 0.3–0.9)
Total Bilirubin: 0.9 mg/dL (ref <1.2)
Total Protein: 7.3 g/dL (ref 6.5–8.1)

## 2023-06-15 LAB — URINALYSIS, COMPLETE (UACMP) WITH MICROSCOPIC
Bilirubin Urine: NEGATIVE
Glucose, UA: NEGATIVE mg/dL
Ketones, ur: NEGATIVE mg/dL
Nitrite: NEGATIVE
Protein, ur: NEGATIVE mg/dL
Specific Gravity, Urine: 1.005 (ref 1.005–1.030)
pH: 6 (ref 5.0–8.0)

## 2023-06-15 LAB — BASIC METABOLIC PANEL
Anion gap: 11 (ref 5–15)
BUN: 16 mg/dL (ref 6–20)
CO2: 22 mmol/L (ref 22–32)
Calcium: 8.6 mg/dL — ABNORMAL LOW (ref 8.9–10.3)
Chloride: 103 mmol/L (ref 98–111)
Creatinine, Ser: 0.56 mg/dL (ref 0.44–1.00)
GFR, Estimated: >60 mL/min (ref >60)
Glucose, Bld: 100 mg/dL — ABNORMAL HIGH (ref 70–99)
Potassium: 4.2 mmol/L (ref 3.5–5.1)
Sodium: 136 mmol/L (ref 135–145)

## 2023-06-15 LAB — CBC
HCT: 43 % (ref 36.0–46.0)
Hemoglobin: 15 g/dL (ref 12.0–15.0)
MCH: 31.4 pg (ref 26.0–34.0)
MCHC: 34.9 g/dL (ref 30.0–36.0)
MCV: 90.1 fL (ref 80.0–100.0)
Platelets: 228 10*3/uL (ref 150–400)
RBC: 4.77 MIL/uL (ref 3.87–5.11)
RDW: 12.3 % (ref 11.5–15.5)
WBC: 8.4 10*3/uL (ref 4.0–10.5)
nRBC: 0 % (ref 0.0–0.2)

## 2023-06-15 LAB — TROPONIN I (HIGH SENSITIVITY)
Troponin I (High Sensitivity): 3 ng/L (ref <18)
Troponin I (High Sensitivity): 2 ng/L (ref <18)

## 2023-06-15 LAB — D-DIMER, QUANTITATIVE: D-Dimer, Quant: <0.27 ug{FEU}/mL (ref 0.00–0.50)

## 2023-06-15 LAB — POC URINE PREG, ED: Preg Test, Ur: NEGATIVE

## 2023-06-15 LAB — LIPASE, BLOOD: Lipase: 42 U/L (ref 11–51)

## 2023-06-15 NOTE — ED Triage Notes (Signed)
Pt to ED for chest pain x1 hour. Denies n/v/ shob. Denies radiation of pain.

## 2023-06-15 NOTE — ED Provider Notes (Signed)
[facility] Provider Note    Event Date/Time   First MD Initiated Contact with Patient 06/15/23 1103     (approximate)  History   Chief Complaint: Chest Pain  HPI  Brooke Anthony is a 42 y.o. female with a past medical history of anemia, migraines, presents to the emergency department for chest pain.  According to the patient for 1 to 2 hours prior to presentation patient was experiencing pain in her chest and shortness of breath.  Patient states a history of similar chest pain and shortness of breath which occurs fairly frequently she relates this often times to smoking.  Patient states she tries not to come to the hospital but this time the pain was not relieving itself so she came to the emergency department for evaluation.  Patient denies any pleuritic pain.  States mild central sternal discomfort.  Denies any worsening with exertion or movement.  Denies any leg pain or swelling.  Patient is on birth control.  Physical Exam   Triage Vital Signs: ED Triage Vitals  Encounter Vitals Group     BP 06/15/23 0939 112/71     Systolic BP Percentile --      Diastolic BP Percentile --      Pulse Rate 06/15/23 0939 78     Resp 06/15/23 0939 18     Temp 06/15/23 0939 97.8 F (36.6 C)     Temp Source 06/15/23 0939 Oral     SpO2 06/15/23 0939 97 %     Weight 06/15/23 0936 153 lb (69.4 kg)     Height 06/15/23 0936 5\' 1"  (1.549 m)     Head Circumference --      Peak Flow --      Pain Score 06/15/23 0936 7     Pain Loc --      Pain Education --      Exclude from Growth Chart --     Most recent vital signs: Vitals:   06/15/23 0939  BP: 112/71  Pulse: 78  Resp: 18  Temp: 97.8 F (36.6 C)  SpO2: 97%    General: Awake, no distress.  CV:  Good peripheral perfusion.  Regular rate and rhythm  Resp:  Normal effort.  Equal breath sounds bilaterally.  Chest is nontender to palpation or compression. Abd:  No distention.  Soft, nontender.  No rebound or  guarding.  Benign abdomen. Other:  No lower extremity IMA or tenderness.   ED Results / Procedures / Treatments   EKG  EKG viewed and interpreted by myself shows normal sinus rhythm 82 bpm with a narrow QRS, normal axis, normal intervals, no concerning ST changes.  RADIOLOGY  I have reviewed and interpreted chest x-ray images.  No obvious consolidation on my evaluation.   MEDICATIONS ORDERED IN ED: Medications - No data to display   IMPRESSION / MDM / ASSESSMENT AND PLAN / ED COURSE  I reviewed the triage vital signs and the nursing notes.  Patient's presentation is most consistent with acute presentation with potential threat to life or bodily function.  Patient presents to the emergency department for chest discomfort and mild shortness of breath.  States the shortness of breath has resolved but continues to have very slight pain in the center of her chest.  Patient states this is an ongoing issue for her but usually the pain resolves on its own.  Denies any history of reflux.  Denies any cardiac history or stents.  Denies any pleuritic nature to  the chest pain no leg pain or swelling but the patient is on birth control.  Reassuring vital signs.  Given chest pain on birth control I have added on a D-dimer to the patient's workup.  Patient's workup otherwise is reassuring with a normal CBC normal chemistry negative troponin.  Given her symptoms started 1 to 2 hours before presentation we will repeat a troponin after 2 hours.  I have also added on hepatic function panel and lipase although benign abdomen on my exam.  Overall the patient appears well, no distress, reassuring exam chest x-ray EKG and lab work so far.  Patient's workup is reassuring D-dimer is negative pregnancy test is negative, urinalysis is normal.  Troponin negative x 2.  CBC is normal, chemistry is normal lipase is normal hepatic function panel is normal.  Chest x-ray is clear and EKG reassuring.  Given the patient's  reassuring workup patient is very reassured.  Patient will follow-up with her primary care doctor.  Provided my typical chest pain return precautions.  FINAL CLINICAL IMPRESSION(S) / ED DIAGNOSES   Chest pain    Note:  This document was prepared using Dragon voice recognition software and may include unintentional dictation errors.   Minna Antis, MD 06/15/23 1316
